# Patient Record
Sex: Male | Born: 1960 | Race: White | Hispanic: No | Marital: Single | Smoking: Current some day smoker
Health system: Southern US, Community
[De-identification: ages and names within clinical notes are randomized; demographics above are authoritative.]

## PROBLEM LIST (undated history)

## (undated) DIAGNOSIS — I10 Essential (primary) hypertension: Secondary | ICD-10-CM

## (undated) HISTORY — PX: TONSILLECTOMY: SUR1361

---

## 2002-09-13 ENCOUNTER — Inpatient Hospital Stay (HOSPITAL_COMMUNITY): Admission: EM | Admit: 2002-09-13 | Discharge: 2002-09-16 | Payer: Self-pay | Admitting: Emergency Medicine

## 2002-09-13 ENCOUNTER — Encounter: Payer: Self-pay | Admitting: Emergency Medicine

## 2002-09-29 ENCOUNTER — Encounter: Payer: Self-pay | Admitting: General Surgery

## 2002-09-29 ENCOUNTER — Ambulatory Visit (HOSPITAL_COMMUNITY): Admission: RE | Admit: 2002-09-29 | Discharge: 2002-09-29 | Payer: Self-pay | Admitting: General Surgery

## 2003-08-23 ENCOUNTER — Ambulatory Visit (HOSPITAL_COMMUNITY): Admission: RE | Admit: 2003-08-23 | Discharge: 2003-08-23 | Payer: Self-pay | Admitting: Gastroenterology

## 2003-08-23 ENCOUNTER — Encounter (INDEPENDENT_AMBULATORY_CARE_PROVIDER_SITE_OTHER): Payer: Self-pay | Admitting: *Deleted

## 2009-12-05 ENCOUNTER — Emergency Department (HOSPITAL_COMMUNITY): Admission: EM | Admit: 2009-12-05 | Discharge: 2009-12-05 | Payer: Self-pay | Admitting: Emergency Medicine

## 2010-06-29 LAB — DIFFERENTIAL
Basophils Absolute: 0 10*3/uL (ref 0.0–0.1)
Basophils Relative: 0 % (ref 0–1)
Eosinophils Absolute: 0.1 10*3/uL (ref 0.0–0.7)
Monocytes Absolute: 0.6 10*3/uL (ref 0.1–1.0)
Monocytes Relative: 8 % (ref 3–12)
Neutro Abs: 5.2 10*3/uL (ref 1.7–7.7)
Neutrophils Relative %: 67 % (ref 43–77)

## 2010-06-29 LAB — CBC
HCT: 43.9 % (ref 39.0–52.0)
MCH: 30.4 pg (ref 26.0–34.0)
MCHC: 35.1 g/dL (ref 30.0–36.0)
MCV: 86.6 fL (ref 78.0–100.0)
Platelets: 189 10*3/uL (ref 150–400)
RDW: 13.4 % (ref 11.5–15.5)
WBC: 7.8 10*3/uL (ref 4.0–10.5)

## 2010-06-29 LAB — POCT CARDIAC MARKERS
CKMB, poc: 1.1 ng/mL (ref 1.0–8.0)
Troponin i, poc: <0.05 ng/mL (ref 0.00–0.09)
Troponin i, poc: <0.05 ng/mL (ref 0.00–0.09)

## 2010-06-29 LAB — POCT I-STAT, CHEM 8
Calcium, Ion: 1.01 mmol/L — ABNORMAL LOW (ref 1.12–1.32)
Chloride: 108 mEq/L (ref 96–112)
Glucose, Bld: 88 mg/dL (ref 70–99)
HCT: 48 % (ref 39.0–52.0)
Hemoglobin: 16.3 g/dL (ref 13.0–17.0)
Potassium: 4.2 mEq/L (ref 3.5–5.1)

## 2010-09-01 NOTE — Op Note (Signed)
NAME:  Manuel Singleton, Manuel Singleton                         ACCOUNT NO.:  [id number]   MEDICAL RECORD NO.:  [id number]                   PATIENT TYPE:  AMB   LOCATION:  ENDO                                 FACILITY:  [facility]   PHYSICIAN:  Petra Kuba, M.D.                 DATE OF BIRTH:  [birth year]   DATE OF PROCEDURE:  08/23/2003  DATE OF DISCHARGE:                                 OPERATIVE REPORT   PROCEDURE:  Colonoscopy.   INDICATIONS FOR PROCEDURE:  Patient with a history of diverticulitis,  abnormal CAT scan, want to rule out any other abnormality.   Consent was signed after risks, benefits, methods, and options were  thoroughly discussed by my nurse in the office and me prior to sedation.   MEDICINES USED:  Demerol 75, Versed 8.   DESCRIPTION OF PROCEDURE:  Rectal inspection was pertinent for small  external hemorrhoids. Digital exam was negative. The pediatric video  adjustable colonoscope was inserted and with some difficulty due to a  tortous sigmoid once through this area with abdominal pressure and rolling  him on his back, we were able to advance to the cecum.  On insertion, some  right greater than left diverticula was seen but no other abnormalities. The  cecum was identified by the appendiceal orifice and the ileocecal valve. In  fact, the scope was inserted a short ways into the terminal ileum which was  normal. The scope was slowly withdrawn. The prep was adequate. There was  some liquid stool that required washing and suctioning. On slow withdrawal  through the colon, the right greater then left diverticula were confirmed.  In the distal sigmoid and rectum, a few hyperplastic appearing polyps were  seen and were cold biopsied. Anal rectal pullthrough and retroflexion  confirmed some small hemorrhoids.  The scope was reinserted a short ways up  the left side of  the colon, air was suctioned, scope removed. The patient  tolerated the procedure well. There was no obvious or  immediate  complications.   ENDOSCOPIC DIAGNOSIS:  1. Internal and external hemorrhoids.  2. Right greater than left diverticula.  3. A few tiny hyperplastic appearing rectal and distal sigmoid polyps cold     biopsied.  4. Otherwise within normal limits to the terminal ileum.   PLAN:  Await pathology to determine future colonic screening.  Care with  nuts, seeds and popcorn.  Give him some diverticular information. Return  care to Dr. Artis Flock and Carolynne Edouard and I will be on standby to help p.r.n.                                               Petra Kuba, M.D.    MEM/MEDQ  D:  08/23/2003  T:  08/23/2003  Job:  [id number]  cc:   Quita Skye. Artis Flock, M.D.  [address]  [place]  [place] [address]  Fax: [phone]   Ollen Gross. Vernell Morgans, M.D.  [address]  [place]  [place] [address]  Fax: [phone]

## 2010-09-01 NOTE — H&P (Signed)
NAME:  GEOVANNI, RAHMING                         ACCOUNT NO.:  [id number]   MEDICAL RECORD NO.:  [id number]                   PATIENT TYPE:  EMS   LOCATION:  MINO                                 FACILITY:  [facility]   PHYSICIAN:  Ollen Gross. Vernell Morgans, M.D.              DATE OF BIRTH:  Sep 25, 1960   DATE OF ADMISSION:  09/13/2002  DATE OF DISCHARGE:                                HISTORY & PHYSICAL   CHIEF COMPLAINT:  Right-sided abdominal pain.   HISTORY OF PRESENT ILLNESS:  Mr. Tomaro is a 50 year old white male who  states that he initially began having some pain about 2-3 days ago.  He  initially describes the pain as some burning in his periumbilical and  epigastric sort of region.  The pain then migrated over to his right sort of  upper quadrant.  The pain has not been associated with any nausea or  vomiting.  He has not had a fever until today.  His fever today was  reportedly 102.  Because of the pain has been progressing, he came to the  emergency department for further evaluation.  His bowels have been moving a  regular basis.  They have been a little bit looser the last couple of days  than normal, but he had not noticed any blood in them.  The pain is  localized to his right upper quadrant.  It does not radiate to his back or  down into his right lower quadrant.  His appetite has been okay.  He states  he has otherwise been healthy.   REVIEW OF SYSTEMS:  He denies any nausea, vomiting, fever, chills, chest  pain, shortness of breath, diarrhea, or dysuria.  The rest of his review of  systems is fairly unremarkable.   PAST MEDICAL HISTORY:  None.   PAST SURGICAL HISTORY:  Significant for a tonsillectomy.   MEDICATIONS:  None.   ALLERGIES:  None.   SOCIAL HISTORY:  He occasionally drinks alcohol.  He denies any tobacco use.   FAMILY HISTORY:  Significant for diabetes in his parents.   PHYSICAL EXAMINATION:  GENERAL:  He is well-developed, well-nourished, white  male in no  acute distress.  SKIN:  Warm and dry with no jaundice.  EYES:  His extraocular motors are intact.  Pupils are equal, round, and  reactive to light.  Sclerae are nonicteric.  NECK:  His neck has no bruits.  I cannot palpate any thyroid masses.  His  trachea is midline.  LUNGS:  Clear bilaterally with no use of accessory or respiratory muscles.  HEART:  Regular rate and rhythm with impulse in the left chest.  ABDOMEN:  Soft.  He is nontender on the left side of his abdomen.  He has  some tenderness and guarding in the right upper quadrant.  He has no  palpable mass or hepatosplenomegaly.  No evidence of peritonitis.  He has  some  slight abdominal distention.  EXTREMITIES:  No cyanosis, clubbing, or edema.  PSYCHOLOGICAL:  He is alert and oriented times three with no evidence today  of anxiety or depression.   LABORATORY:  On review of his lab work, he had a creatinine of 1.3, his  total bilirubin 1.4, alk phos 41, SGOT 23, SGPT 36.  His white count is  19,800, hemoglobin 14.7, hematocrit 42.4, and a platelet count of 176,000.  His UA is negative.  On evaluation of his CT scan, he has what appears to be  diverticulitis at his hepatic flexure.  His appendix is visualized and looks  normal.  He also has a small atrophic, but still slightly functioning left  kidney.   ASSESSMENT AND PLAN:  This is a 50 year old white male with three days of  right-sided abdominal pain, fever, and an elevated white count.  His CT scan  shows significant hepatic flexure and diverticulitis.  It is impossible by  scan to rule out a tumor in this area of bowel although the bowel wall does  appear to be fairly thickened from the inflammatory process.  I recommended  admission to the hospital.  We will plan to start him on broad-spectrum  antibiotics to cover colonic floor.  Hopefully, we will be able to cool down  the inflammatory process around his colon, and then have him evaluated by GI  to rule out any tumor.   If this is diverticulitis with no complication, than  we will talk to him about his options.  If he does not improve, he may have  to have surgery to remove this diseased portion of colon.  We will recheck  his white count over the next couple of days and follow his abdominal exam.                                               Ollen Gross. Vernell Morgans, M.D.    PST/MEDQ  D:  09/13/2002  T:  09/13/2002  Job:  [id number]

## 2010-09-01 NOTE — Discharge Summary (Signed)
   NAME:  JOS, CYGAN NO.:  [id number]   MEDICAL RECORD NO.:  [id number]                   PATIENT TYPE:  INP   LOCATION:  5714                                 FACILITY:  [facility]   PHYSICIAN:  Ollen Gross. Vernell Morgans, M.D.              DATE OF BIRTH:  Jun 23, 1960   DATE OF ADMISSION:  09/13/2002  DATE OF DISCHARGE:  09/16/2002                                 DISCHARGE SUMMARY   HISTORY:  Gratefully, the patient is a 50 year old gentleman who was  admitted with right upper quadrant pain.  He was found to have a hepatic  flexure diverticulitis with no complicating features.  He was started on  bowel rest and IV antibiotics and his fevers and white count improved over  the ensuing couple of days and at that point he was switched to oral Cipro  and Flagyl and started initially on a clear liquid diet and advanced to a  low-residue diet and on 09/16/02 he was ready for discharge to home and he  had gotten pretty much pain free.   His activity is as tolerated.  His diet is low-residue.   MEDICATIONS:  He was given prescriptions for:  1. Cipro.  2. Flagyl.  3. Pain medicine as needed.  4. He needs to resume his home medications.   FOLLOWUP:  Followup will be with Dr. Carolynne Edouard in a couple of weeks.   FINAL DIAGNOSIS:  Hepatic flexure diverticulitis and he is discharged home.                                               Ollen Gross. Vernell Morgans, M.D.    PST/MEDQ  D:  10/06/2002  T:  10/07/2002  Job:  [id number]

## 2010-09-02 ENCOUNTER — Emergency Department (HOSPITAL_COMMUNITY): Payer: 59

## 2010-09-02 ENCOUNTER — Emergency Department (HOSPITAL_COMMUNITY)
Admission: EM | Admit: 2010-09-02 | Discharge: 2010-09-02 | Disposition: A | Discharge status: Home / Self Care | Payer: 59 | Attending: Emergency Medicine | Admitting: Emergency Medicine

## 2010-09-02 DIAGNOSIS — X500XXA Overexertion from strenuous movement or load, initial encounter: Secondary | ICD-10-CM | POA: Insufficient documentation

## 2010-09-02 DIAGNOSIS — Y9367 Activity, basketball: Secondary | ICD-10-CM | POA: Insufficient documentation

## 2010-09-02 DIAGNOSIS — IMO0002 Reserved for concepts with insufficient information to code with codable children: Secondary | ICD-10-CM | POA: Insufficient documentation

## 2010-09-02 DIAGNOSIS — M25519 Pain in unspecified shoulder: Secondary | ICD-10-CM | POA: Insufficient documentation

## 2010-09-05 ENCOUNTER — Other Ambulatory Visit (HOSPITAL_COMMUNITY): Payer: Self-pay | Admitting: Orthopedic Surgery

## 2010-09-05 ENCOUNTER — Ambulatory Visit (HOSPITAL_COMMUNITY)
Admission: RE | Admit: 2010-09-05 | Discharge: 2010-09-05 | Disposition: A | Discharge status: Home / Self Care | Payer: 59 | Source: Ambulatory Visit | Attending: Orthopedic Surgery | Admitting: Orthopedic Surgery

## 2010-09-05 DIAGNOSIS — T1590XA Foreign body on external eye, part unspecified, unspecified eye, initial encounter: Secondary | ICD-10-CM

## 2010-09-05 DIAGNOSIS — Z01818 Encounter for other preprocedural examination: Secondary | ICD-10-CM | POA: Insufficient documentation

## 2015-04-12 ENCOUNTER — Ambulatory Visit
Admission: RE | Admit: 2015-04-12 | Discharge: 2015-04-12 | Disposition: A | Discharge status: Home / Self Care | Payer: Commercial Managed Care - HMO | Source: Ambulatory Visit | Attending: Internal Medicine | Admitting: Internal Medicine

## 2015-04-12 ENCOUNTER — Other Ambulatory Visit: Payer: Self-pay | Admitting: Internal Medicine

## 2015-04-12 DIAGNOSIS — J188 Other pneumonia, unspecified organism: Secondary | ICD-10-CM

## 2015-06-12 ENCOUNTER — Encounter (HOSPITAL_COMMUNITY): Payer: Self-pay | Admitting: Emergency Medicine

## 2015-06-12 ENCOUNTER — Emergency Department (HOSPITAL_COMMUNITY): Payer: Commercial Managed Care - HMO

## 2015-06-12 ENCOUNTER — Inpatient Hospital Stay (HOSPITAL_COMMUNITY)
Admission: EM | Admit: 2015-06-12 | Discharge: 2015-06-16 | DRG: 183 | Disposition: A | Discharge status: Home Health | Payer: Commercial Managed Care - HMO | Attending: General Surgery | Admitting: General Surgery

## 2015-06-12 DIAGNOSIS — R0789 Other chest pain: Secondary | ICD-10-CM | POA: Diagnosis present

## 2015-06-12 DIAGNOSIS — S2232XA Fracture of one rib, left side, initial encounter for closed fracture: Secondary | ICD-10-CM | POA: Diagnosis present

## 2015-06-12 DIAGNOSIS — R509 Fever, unspecified: Secondary | ICD-10-CM

## 2015-06-12 DIAGNOSIS — K5900 Constipation, unspecified: Secondary | ICD-10-CM | POA: Diagnosis not present

## 2015-06-12 DIAGNOSIS — F17210 Nicotine dependence, cigarettes, uncomplicated: Secondary | ICD-10-CM | POA: Diagnosis present

## 2015-06-12 DIAGNOSIS — W108XXA Fall (on) (from) other stairs and steps, initial encounter: Secondary | ICD-10-CM | POA: Diagnosis present

## 2015-06-12 DIAGNOSIS — W19XXXA Unspecified fall, initial encounter: Secondary | ICD-10-CM | POA: Diagnosis present

## 2015-06-12 DIAGNOSIS — J918 Pleural effusion in other conditions classified elsewhere: Secondary | ICD-10-CM | POA: Diagnosis present

## 2015-06-12 DIAGNOSIS — N179 Acute kidney failure, unspecified: Secondary | ICD-10-CM | POA: Diagnosis present

## 2015-06-12 DIAGNOSIS — I10 Essential (primary) hypertension: Secondary | ICD-10-CM | POA: Diagnosis present

## 2015-06-12 DIAGNOSIS — J189 Pneumonia, unspecified organism: Secondary | ICD-10-CM | POA: Diagnosis present

## 2015-06-12 DIAGNOSIS — R0902 Hypoxemia: Secondary | ICD-10-CM | POA: Diagnosis present

## 2015-06-12 DIAGNOSIS — W010XXA Fall on same level from slipping, tripping and stumbling without subsequent striking against object, initial encounter: Secondary | ICD-10-CM

## 2015-06-12 DIAGNOSIS — Z885 Allergy status to narcotic agent status: Secondary | ICD-10-CM

## 2015-06-12 DIAGNOSIS — S2242XA Multiple fractures of ribs, left side, initial encounter for closed fracture: Secondary | ICD-10-CM | POA: Diagnosis present

## 2015-06-12 DIAGNOSIS — R748 Abnormal levels of other serum enzymes: Secondary | ICD-10-CM

## 2015-06-12 HISTORY — DX: Essential (primary) hypertension: I10

## 2015-06-12 LAB — CBC WITH DIFFERENTIAL/PLATELET
Basophils Absolute: 0 10*3/uL (ref 0.0–0.1)
Basophils Relative: 0 %
EOS ABS: 0.1 10*3/uL (ref 0.0–0.7)
EOS PCT: 0 %
HCT: 50.6 % (ref 39.0–52.0)
Hemoglobin: 17.2 g/dL — ABNORMAL HIGH (ref 13.0–17.0)
LYMPHS ABS: 1.1 10*3/uL (ref 0.7–4.0)
Lymphocytes Relative: 7 %
MCH: 30.4 pg (ref 26.0–34.0)
MCHC: 34 g/dL (ref 30.0–36.0)
MCV: 89.4 fL (ref 78.0–100.0)
MONO ABS: 1.9 10*3/uL — AB (ref 0.1–1.0)
Monocytes Relative: 12 %
Neutro Abs: 13.1 10*3/uL — ABNORMAL HIGH (ref 1.7–7.7)
Neutrophils Relative %: 81 %
PLATELETS: 161 10*3/uL (ref 150–400)
RBC: 5.66 MIL/uL (ref 4.22–5.81)
RDW: 13.1 % (ref 11.5–15.5)
WBC: 16.1 10*3/uL — AB (ref 4.0–10.5)

## 2015-06-12 LAB — URINALYSIS, ROUTINE W REFLEX MICROSCOPIC
BILIRUBIN URINE: NEGATIVE
Glucose, UA: NEGATIVE mg/dL
HGB URINE DIPSTICK: NEGATIVE
KETONES UR: 15 mg/dL — AB
Leukocytes, UA: NEGATIVE
Nitrite: NEGATIVE
PROTEIN: 30 mg/dL — AB
Specific Gravity, Urine: 1.022 (ref 1.005–1.030)
pH: 6 (ref 5.0–8.0)

## 2015-06-12 LAB — URINE MICROSCOPIC-ADD ON: BACTERIA UA: NONE SEEN

## 2015-06-12 LAB — CK: CK TOTAL: 758 U/L — AB (ref 49–397)

## 2015-06-12 LAB — I-STAT CG4 LACTIC ACID, ED
LACTIC ACID, VENOUS: 1.01 mmol/L (ref 0.5–2.0)
Lactic Acid, Venous: 2.04 mmol/L (ref 0.5–2.0)

## 2015-06-12 LAB — COMPREHENSIVE METABOLIC PANEL
ALT: 60 U/L (ref 17–63)
AST: 46 U/L — AB (ref 15–41)
Albumin: 4.2 g/dL (ref 3.5–5.0)
Alkaline Phosphatase: 48 U/L (ref 38–126)
Anion gap: 14 (ref 5–15)
BILIRUBIN TOTAL: 1.3 mg/dL — AB (ref 0.3–1.2)
BUN: 15 mg/dL (ref 6–20)
CO2: 25 mmol/L (ref 22–32)
CREATININE: 1.29 mg/dL — AB (ref 0.61–1.24)
Calcium: 9.6 mg/dL (ref 8.9–10.3)
Chloride: 98 mmol/L — ABNORMAL LOW (ref 101–111)
GFR calc Af Amer: >60 mL/min (ref >60)
GLUCOSE: 137 mg/dL — AB (ref 65–99)
Potassium: 4.3 mmol/L (ref 3.5–5.1)
Sodium: 137 mmol/L (ref 135–145)
TOTAL PROTEIN: 7.3 g/dL (ref 6.5–8.1)

## 2015-06-12 MED ORDER — OXYCODONE HCL 5 MG PO TABS: 5.0000 mg | ORAL_TABLET | ORAL | Status: DC | PRN

## 2015-06-12 MED ORDER — SODIUM CHLORIDE 0.9 % IV BOLUS (SEPSIS)
1000.0000 mL | INTRAVENOUS | Status: AC
  Administered 2015-06-12 (×3): 1000 mL via INTRAVENOUS

## 2015-06-12 MED ORDER — DEXTROSE 5 % IV SOLN
500.0000 mg | INTRAVENOUS | Status: DC
  Administered 2015-06-13 – 2015-06-15 (×2): 500 mg via INTRAVENOUS
  Filled 2015-06-12 (×3): qty 500

## 2015-06-12 MED ORDER — FENTANYL CITRATE (PF) 100 MCG/2ML IJ SOLN
100.0000 ug | Freq: Once | INTRAMUSCULAR | Status: AC
  Administered 2015-06-12: 100 ug via INTRAVENOUS

## 2015-06-12 MED ORDER — HYDROMORPHONE HCL 1 MG/ML IJ SOLN
1.0000 mg | Freq: Once | INTRAMUSCULAR | Status: AC
Start: 2015-06-12 — End: 2015-06-12
  Administered 2015-06-12: 1 mg via INTRAVENOUS
  Filled 2015-06-12: qty 1

## 2015-06-12 MED ORDER — DEXTROSE 5 % IV SOLN
500.0000 mg | Freq: Once | INTRAVENOUS | Status: AC
  Administered 2015-06-12: 500 mg via INTRAVENOUS
  Filled 2015-06-12: qty 500

## 2015-06-12 MED ORDER — SODIUM CHLORIDE 0.9 % IV SOLN
INTRAVENOUS | Status: DC
  Administered 2015-06-12 – 2015-06-13 (×2): via INTRAVENOUS

## 2015-06-12 MED ORDER — ONDANSETRON HCL 4 MG/2ML IJ SOLN
4.0000 mg | Freq: Four times a day (QID) | INTRAMUSCULAR | Status: DC | PRN
  Filled 2015-06-12: qty 2

## 2015-06-12 MED ORDER — FENTANYL CITRATE (PF) 100 MCG/2ML IJ SOLN
INTRAMUSCULAR | Status: AC
  Filled 2015-06-12: qty 2

## 2015-06-12 MED ORDER — DOCUSATE SODIUM 100 MG PO CAPS
100.0000 mg | ORAL_CAPSULE | Freq: Two times a day (BID) | ORAL | Status: DC
  Administered 2015-06-12 – 2015-06-16 (×8): 100 mg via ORAL
  Filled 2015-06-12 (×8): qty 1

## 2015-06-12 MED ORDER — HYDROMORPHONE HCL 1 MG/ML IJ SOLN
1.0000 mg | INTRAMUSCULAR | Status: DC | PRN
  Administered 2015-06-12 – 2015-06-13 (×3): 1 mg via INTRAVENOUS
  Filled 2015-06-12 (×3): qty 1

## 2015-06-12 MED ORDER — SODIUM CHLORIDE 0.9 % IV BOLUS (SEPSIS)
500.0000 mL | INTRAVENOUS | Status: AC
  Administered 2015-06-12: 500 mL via INTRAVENOUS

## 2015-06-12 MED ORDER — ONDANSETRON HCL 4 MG PO TABS
4.0000 mg | ORAL_TABLET | Freq: Four times a day (QID) | ORAL | Status: DC | PRN
Start: 2015-06-12 — End: 2015-06-16

## 2015-06-12 MED ORDER — DEXTROSE 5 % IV SOLN
1.0000 g | Freq: Once | INTRAVENOUS | Status: AC
  Administered 2015-06-12: 1 g via INTRAVENOUS
  Filled 2015-06-12: qty 10

## 2015-06-12 MED ORDER — ENOXAPARIN SODIUM 40 MG/0.4ML ~~LOC~~ SOLN
40.0000 mg | Freq: Every day | SUBCUTANEOUS | Status: DC
  Administered 2015-06-12: 40 mg via SUBCUTANEOUS
  Filled 2015-06-12: qty 0.4

## 2015-06-12 MED ORDER — OXYCODONE HCL 5 MG PO TABS
10.0000 mg | ORAL_TABLET | ORAL | Status: DC | PRN
  Administered 2015-06-13 (×3): 10 mg via ORAL
  Filled 2015-06-12 (×3): qty 2

## 2015-06-12 MED ORDER — DEXTROSE 5 % IV SOLN
1.0000 g | INTRAVENOUS | Status: DC
  Administered 2015-06-13 – 2015-06-15 (×3): 1 g via INTRAVENOUS
  Filled 2015-06-12 (×4): qty 10

## 2015-06-12 NOTE — ED Provider Notes (Signed)
Complains of left-sided chest pain worse with moving or changing positions after he tripped and fell 2 days ago. No loss of consciousness. No other associated symptoms. He's been lying in his bed since falling unable to move secondary to pain since the event. On exam appears uncomfortable. On exam alert last coma score 15 appears uncomfortable chest is tender left-sided, laterally. No crepitance or flail. Abdomen nondistended nontender. Pelvis stable nontender. All 4 extremities or contusion abrasion or tenderness neurovascular intact  Orlie Dakin, MD 06/12/15 2330

## 2015-06-12 NOTE — ED Notes (Signed)
Patient transported to CT 

## 2015-06-12 NOTE — Progress Notes (Signed)
Pharmacy Code Sepsis Protocol  Time of code sepsis page: [id number] []  Antibiotics delivered at  [x]  Antibiotics administered (Ordered) prior to code at [facility] (if checked, omit next 2 questions)  Were antibiotics ordered at the time of the code sepsis page? No Was it required to contact the physician? [x]  Physician not contacted []  Physician contacted to order antibiotics for code sepsis []  Physician contacted to recommend changing antibiotics  Pharmacy consulted for: N/A  Anti-infectives    Start     Dose/Rate Route Frequency Ordered Stop   06/13/15 1900  cefTRIAXone (ROCEPHIN) 1 g in dextrose 5 % 50 mL IVPB     1 g 100 mL/hr over 30 Minutes Intravenous Every 24 hours 06/12/15 1822     06/13/15 1900  azithromycin (ZITHROMAX) 500 mg in dextrose 5 % 250 mL IVPB     500 mg 250 mL/hr over 60 Minutes Intravenous Every 24 hours 06/12/15 1822     06/12/15 1815  cefTRIAXone (ROCEPHIN) 1 g in dextrose 5 % 50 mL IVPB     1 g 100 mL/hr over 30 Minutes Intravenous  Once 06/12/15 1813     06/12/15 1815  azithromycin (ZITHROMAX) 500 mg in dextrose 5 % 250 mL IVPB     500 mg 250 mL/hr over 60 Minutes Intravenous  Once 06/12/15 1813          Nurse education provided: []  Minutes left to administer antibiotics to achieve 1 hour goal []  Correct order of antibiotic administration []  Antibiotic Y-site compatibilities     Shomari Scicchitano, Jake Church, PharmD 06/12/2015, 6:53 PM

## 2015-06-12 NOTE — ED Notes (Addendum)
Pt tripped and fell down 3 stairs on Friday, landed against L side hitting ribcage and LUE.  Pt was not seen after fall. Pt denies LOC, denies hitting head.  Pt denies dizziness, lightheadedness.  Pt reports SOB , worse with movement.  Pt reports new fever starting today.  EMS reports giving 100 mcg Fentanyl.

## 2015-06-12 NOTE — ED Notes (Signed)
H. Muttherbaugh. PA  at bedside.

## 2015-06-12 NOTE — ED Notes (Signed)
MD at bedside. 

## 2015-06-12 NOTE — ED Provider Notes (Signed)
CSN: [id number]     Arrival date & time 06/12/15  1727 History   First MD Initiated Contact with Patient 06/12/15 1729     Chief Complaint  Patient presents with  . Fall  . Chest Pain  . Fever     (Consider location/radiation/quality/duration/timing/severity/associated sxs/prior Treatment) The history is provided by the patient, a significant other and the EMS personnel. No language interpreter was used.     Manuel Singleton is a 55 y.o. male  with a hx of HTN presents to the Emergency Department complaining of gradual, persistent, progressively worsening with rib pain onset 3 days ago. Patient reports that he tripped over an extension cord, falling onto a railing which gave way causing him to fall down 3 stairs. Nursing note states 3 flights of stairs however patient is very clear that he only fell down 3 stairs. He reports that he immediately got up, went to bed and has not been out of bed since that time. He has been unable to get up even to urinate.  He reports severe pain to the left ribs and left back. EMS was called today due to severe shortness of breath. Patient found to be hypoxic and febrile on their arrival. He was transported via long spine board due to difficulty moving patient.   Patient reports taking hydrocodone and oxycodone prior to arrival without improvement. Palpation makes it worse. Patient denies cough, chills, abdominal pain, nausea, vomiting, diarrhea, dizziness, syncope.    Past Medical History  Diagnosis Date  . Hypertension    Past Surgical History  Procedure Laterality Date  . Tonsillectomy     No family history on file. Social History  Substance Use Topics  . Smoking status: Current Some Day Smoker    Types: Cigarettes  . Smokeless tobacco: Current User    Types: Snuff  . Alcohol Use: Yes     Comment: occassionally    Review of Systems  Constitutional: Positive for fever. Negative for diaphoresis, appetite change, fatigue and unexpected weight  change.  HENT: Negative for mouth sores.   Eyes: Negative for visual disturbance.  Respiratory: Positive for shortness of breath. Negative for cough, chest tightness and wheezing.   Cardiovascular: Positive for chest pain.  Gastrointestinal: Negative for nausea, vomiting, abdominal pain, diarrhea and constipation.  Endocrine: Negative for polydipsia, polyphagia and polyuria.  Genitourinary: Negative for dysuria, urgency, frequency and hematuria.  Musculoskeletal: Positive for back pain. Negative for neck stiffness.  Skin: Negative for rash.  Allergic/Immunologic: Negative for immunocompromised state.  Neurological: Negative for syncope, light-headedness and headaches.  Hematological: Does not bruise/bleed easily.  Psychiatric/Behavioral: Negative for sleep disturbance. The patient is not nervous/anxious.       Allergies  Morphine and related  Home Medications   Prior to Admission medications   Medication Sig Start Date End Date Taking? Authorizing Provider  HYDROcodone-acetaminophen (NORCO/VICODIN) 5-325 MG tablet Take 1 tablet by mouth daily as needed. 03/24/15  Yes Historical Provider, MD  ibuprofen (ADVIL,MOTRIN) 200 MG tablet Take 400 mg by mouth every 6 (six) hours as needed for moderate pain.   Yes Historical Provider, MD  oxyCODONE-acetaminophen (PERCOCET/ROXICET) 5-325 MG tablet Take 1 tablet by mouth daily as needed. 03/29/15  Yes Historical Provider, MD   BP 149/90 mmHg  Pulse 88  Temp(Src) 102.4 F (39.1 C) (Oral)  Resp 18  Ht 6\' 2"  (1.88 m)  Wt 108.863 kg  BMI 30.80 kg/m2  SpO2 97% Physical Exam  Constitutional: He appears well-developed and well-nourished. No distress.  Awake, alert, nontoxic appearance  HENT:  Head: Normocephalic and atraumatic.  Mouth/Throat: Oropharynx is clear and moist. No oropharyngeal exudate.  Eyes: Conjunctivae are normal. No scleral icterus.  Neck: Normal range of motion. Neck supple.  Cardiovascular: Normal rate, regular rhythm and  intact distal pulses.   Pulmonary/Chest: Effort normal. Tachypnea noted. No respiratory distress. He has decreased breath sounds in the left middle field and the left lower field. He has no wheezes. He exhibits tenderness.  Equal chest expansion Clear but coarse breath sounds in right lung fields anteriorly and laterally Course breath sounds left upper field, absent left lower and left lateral fields Unable to assess posterior breath sounds due to patient positioning and pain  Abdominal: Soft. Bowel sounds are normal. He exhibits no mass. There is no tenderness. There is no rebound and no guarding.  Musculoskeletal: Normal range of motion. He exhibits no edema.  Neurological: He is alert.  Speech is clear and goal oriented Moves extremities without ataxia  Skin: Skin is warm and dry. He is not diaphoretic.  Psychiatric: He has a normal mood and affect.  Nursing note and vitals reviewed.   ED Course  Procedures (including critical care time) Labs Review Labs Reviewed  COMPREHENSIVE METABOLIC PANEL - Abnormal; Notable for the following:    Chloride 98 (*)    Glucose, Bld 137 (*)    Creatinine, Ser 1.29 (*)    AST 46 (*)    Total Bilirubin 1.3 (*)    All other components within normal limits  CBC WITH DIFFERENTIAL/PLATELET - Abnormal; Notable for the following:    WBC 16.1 (*)    Hemoglobin 17.2 (*)    Neutro Abs 13.1 (*)    Monocytes Absolute 1.9 (*)    All other components within normal limits  CK - Abnormal; Notable for the following:    Total CK 758 (*)    All other components within normal limits  I-STAT CG4 LACTIC ACID, ED - Abnormal; Notable for the following:    Lactic Acid, Venous 2.04 (*)    All other components within normal limits  CULTURE, BLOOD (ROUTINE X 2)  CULTURE, BLOOD (ROUTINE X 2)  URINE CULTURE  URINALYSIS, ROUTINE W REFLEX MICROSCOPIC (NOT AT [facility])  I-STAT CG4 LACTIC ACID, ED    Imaging Review Ct Chest Wo Contrast  06/12/2015  CLINICAL DATA:  Fall 2  days ago down stairs. Left-sided rib pain. Chest and back pain. Shortness of breath. Sepsis. Pneumonia. EXAM: CT CHEST WITHOUT CONTRAST TECHNIQUE: Multidetector CT imaging of the chest was performed following the standard protocol without IV contrast. COMPARISON:  06/12/2015 FINDINGS: Mediastinum/Nodes: Very minimal atherosclerotic calcification of the aortic arch. Lungs/Pleura: Small bilateral pleural effusions. There is a small amount of hematoma along the left pleural space associated with the various rib fractures. Indistinct mild airspace opacities are scattered in the right lung. Suspected mild atelectasis in the lingula. Passive atelectasis in both lower lobes. No pneumothorax. Upper abdomen: Hepatic steatosis. Musculoskeletal: There are anterior fractures of the left third, fourth, fifth, and sixth ribs which are relatively nondisplaced; and also fractures of the left fourth, fifth, sixth, seventh, eighth, and ninth ribs which are variably displaced, with the sixth rib being the most displaced about 1 bone width with slight overlap. Despite the presence of the small right pleural effusion, I do not see a definite right-sided rib fracture. Note is made of generally hypertrophic musculature in the upper chest and shoulders. IMPRESSION: 1. Left anterior third through sixth rib fractures and  left posterior fourth through ninth displaced rib fractures. Small left pleural effusion with passive atelectasis. 2. There is a is trace right pleural effusion with passive atelectasis but without definite right-sided rib fractures. 3. There is some scattered due a somewhat nodular airspace opacities in both lungs potentially pulmonary contusions or multifocal pneumonia. These are present along with mild scattered atelectasis. 4. Hepatic steatosis. Electronically Signed   By: Van Clines M.D.   On: 06/12/2015 20:27   Dg Chest Portable 1 View  06/12/2015  CLINICAL DATA:  Fall 2 days ago. Severe chest pain.  Decreased breath sounds on left side. EXAM: PORTABLE CHEST 1 VIEW COMPARISON:  04/12/2015 FINDINGS: Exam is suboptimal due to low lung volumes. Mild atelectasis noted in both lung bases. No evidence of pneumothorax or hemothorax. No evidence of pulmonary consolidation. Heart size is probably within normal limits allowing for low lung volumes. Trachea is midline. IMPRESSION: Low lung volumes with mild bibasilar atelectasis. No definite pneumothorax or hemothorax visualized. Electronically Signed   By: Earle Gell M.D.   On: 06/12/2015 18:49   I have personally reviewed and evaluated these images and lab results as part of my medical decision-making.   CRITICAL CARE Performed by: Abigail Butts Total critical care time: 45 minutes Critical care time was exclusive of separately billable procedures and treating other patients. Critical care was necessary to treat or prevent imminent or life-threatening deterioration. Critical care was time spent personally by me on the following activities: development of treatment plan with patient and/or surrogate as well as nursing, discussions with consultants, evaluation of patient's response to treatment, examination of patient, obtaining history from patient or surrogate, ordering and performing treatments and interventions, ordering and review of laboratory studies, ordering and review of radiographic studies, pulse oximetry and re-evaluation of patient's condition.   MDM   Final diagnoses:  Rib fractures, left, closed, initial encounter  Fall from slip, trip, or stumble, initial encounter  Fever, unspecified fever cause  Elevated CK  Community acquired pneumonia   Manuel Singleton presents with fever, left rib pain, hypoxia 3 days after fall.  He reports severe shortness of breath. He has been unable to move since the fall.   Pt unable to roll over on his own.    Elevated lactic acid, leukocytosis and elevated CK.  Patient is febrile and hypoxic,  requiring 3 L of oxygen to maintain his saturations. New oxygen requirement.  Patient treated with azithromycin and ceftriaxone.  CT shows rib fractures 3 through 9 with small pleural effusion.  Airspace opacities noted question pulmonary contusion versus multifocal pneumonia however based on his fever, elevated lactic acid and new oxygen requirement I suspect pneumonia. Will admit to trauma services.   8:49 PM Discussed with Dr. Georgette Dover who will admit.    The patient was discussed with and seen by Dr. Winfred Leeds who agrees with the treatment plan.   Jarrett Soho Zabdiel Dripps, PA-C 06/12/15 2055  Orlie Dakin, MD 06/12/15 2330

## 2015-06-12 NOTE — H&P (Signed)
Manuel Singleton is an 55 y.o. male.   Chief Complaint: Hypoxia/ left chest pain HPI: This is a 55 yo male who fell down three stairs and landed on concrete about 48 hours ago.  He had severe left chest pain and has been in bed since that time.  He had to urinate in a bottle and has not had a bowel movement since that time.  EMS was called today because of fever and shortness of breath.  He was found to be hypoxic.  He had some PRN Vicodin and Percocet, but this was not able to control his pain.    Past Medical History  Diagnosis Date  . Hypertension     Past Surgical History  Procedure Laterality Date  . Tonsillectomy      No family history on file. Social History:  reports that he has been smoking Cigarettes.  His smokeless tobacco use includes Snuff. He reports that he drinks alcohol. He reports that he does not use illicit drugs.  Allergies:  Allergies  Allergen Reactions  . Morphine And Related Itching   Prior to Admission medications   Medication Sig Start Date End Date Taking? Authorizing Provider  HYDROcodone-acetaminophen (NORCO/VICODIN) 5-325 MG tablet Take 1 tablet by mouth daily as needed. 03/24/15  Yes Historical Provider, MD  ibuprofen (ADVIL,MOTRIN) 200 MG tablet Take 400 mg by mouth every 6 (six) hours as needed for moderate pain.   Yes Historical Provider, MD  oxyCODONE-acetaminophen (PERCOCET/ROXICET) 5-325 MG tablet Take 1 tablet by mouth daily as needed. 03/29/15  Yes Historical Provider, MD     Results for orders placed or performed during the hospital encounter of 06/12/15 (from the past 48 hour(s))  Comprehensive metabolic panel     Status: Abnormal   Collection Time: 06/12/15  5:38 PM  Result Value Ref Range   Sodium 137 135 - 145 mmol/L   Potassium 4.3 3.5 - 5.1 mmol/L   Chloride 98 (L) 101 - 111 mmol/L   CO2 25 22 - 32 mmol/L   Glucose, Bld 137 (H) 65 - 99 mg/dL   BUN 15 6 - 20 mg/dL   Creatinine, Ser 1.29 (H) 0.61 - 1.24 mg/dL   Calcium 9.6 8.9 - 10.3  mg/dL   Total Protein 7.3 6.5 - 8.1 g/dL   Albumin 4.2 3.5 - 5.0 g/dL   AST 46 (H) 15 - 41 U/L   ALT 60 17 - 63 U/L   Alkaline Phosphatase 48 38 - 126 U/L   Total Bilirubin 1.3 (H) 0.3 - 1.2 mg/dL   GFR calc non Af Amer >60 >60 mL/min   GFR calc Af Amer >60 >60 mL/min    Comment: (NOTE) The eGFR has been calculated using the CKD EPI equation. This calculation has not been validated in all clinical situations. eGFR's persistently <60 mL/min signify possible Chronic Kidney Disease.    Anion gap 14 5 - 15  CBC WITH DIFFERENTIAL     Status: Abnormal   Collection Time: 06/12/15  5:38 PM  Result Value Ref Range   WBC 16.1 (H) 4.0 - 10.5 K/uL   RBC 5.66 4.22 - 5.81 MIL/uL   Hemoglobin 17.2 (H) 13.0 - 17.0 g/dL   HCT 50.6 39.0 - 52.0 %   MCV 89.4 78.0 - 100.0 fL   MCH 30.4 26.0 - 34.0 pg   MCHC 34.0 30.0 - 36.0 g/dL   RDW 13.1 11.5 - 15.5 %   Platelets 161 150 - 400 K/uL   Neutrophils Relative %  81 %   Neutro Abs 13.1 (H) 1.7 - 7.7 K/uL   Lymphocytes Relative 7 %   Lymphs Abs 1.1 0.7 - 4.0 K/uL   Monocytes Relative 12 %   Monocytes Absolute 1.9 (H) 0.1 - 1.0 K/uL   Eosinophils Relative 0 %   Eosinophils Absolute 0.1 0.0 - 0.7 K/uL   Basophils Relative 0 %   Basophils Absolute 0.0 0.0 - 0.1 K/uL  CK     Status: Abnormal   Collection Time: 06/12/15  5:38 PM  Result Value Ref Range   Total CK 758 (H) 49 - 397 U/L  I-Stat CG4 Lactic Acid, ED  (not at  [facility])     Status: Abnormal   Collection Time: 06/12/15  6:23 PM  Result Value Ref Range   Lactic Acid, Venous 2.04 (HH) 0.5 - 2.0 mmol/L   Comment NOTIFIED PHYSICIAN   Urinalysis, Routine w reflex microscopic (not at [facility])     Status: Abnormal   Collection Time: 06/12/15  8:26 PM  Result Value Ref Range   Color, Urine YELLOW YELLOW   APPearance CLEAR CLEAR   Specific Gravity, Urine 1.022 1.005 - 1.030   pH 6.0 5.0 - 8.0   Glucose, UA NEGATIVE NEGATIVE mg/dL   Hgb urine dipstick NEGATIVE NEGATIVE   Bilirubin Urine NEGATIVE  NEGATIVE   Ketones, ur 15 (A) NEGATIVE mg/dL   Protein, ur 30 (A) NEGATIVE mg/dL   Nitrite NEGATIVE NEGATIVE   Leukocytes, UA NEGATIVE NEGATIVE  Urine microscopic-add on     Status: Abnormal   Collection Time: 06/12/15  8:26 PM  Result Value Ref Range   Squamous Epithelial / LPF 0-5 (A) NONE SEEN   WBC, UA 0-5 0 - 5 WBC/hpf   RBC / HPF 0-5 0 - 5 RBC/hpf   Bacteria, UA NONE SEEN NONE SEEN   Ct Chest Wo Contrast  06/12/2015  CLINICAL DATA:  Fall 2 days ago down stairs. Left-sided rib pain. Chest and back pain. Shortness of breath. Sepsis. Pneumonia. EXAM: CT CHEST WITHOUT CONTRAST TECHNIQUE: Multidetector CT imaging of the chest was performed following the standard protocol without IV contrast. COMPARISON:  06/12/2015 FINDINGS: Mediastinum/Nodes: Very minimal atherosclerotic calcification of the aortic arch. Lungs/Pleura: Small bilateral pleural effusions. There is a small amount of hematoma along the left pleural space associated with the various rib fractures. Indistinct mild airspace opacities are scattered in the right lung. Suspected mild atelectasis in the lingula. Passive atelectasis in both lower lobes. No pneumothorax. Upper abdomen: Hepatic steatosis. Musculoskeletal: There are anterior fractures of the left third, fourth, fifth, and sixth ribs which are relatively nondisplaced; and also fractures of the left fourth, fifth, sixth, seventh, eighth, and ninth ribs which are variably displaced, with the sixth rib being the most displaced about 1 bone width with slight overlap. Despite the presence of the small right pleural effusion, I do not see a definite right-sided rib fracture. Note is made of generally hypertrophic musculature in the upper chest and shoulders. IMPRESSION: 1. Left anterior third through sixth rib fractures and left posterior fourth through ninth displaced rib fractures. Small left pleural effusion with passive atelectasis. 2. There is a is trace right pleural effusion with  passive atelectasis but without definite right-sided rib fractures. 3. There is some scattered due a somewhat nodular airspace opacities in both lungs potentially pulmonary contusions or multifocal pneumonia. These are present along with mild scattered atelectasis. 4. Hepatic steatosis. Electronically Signed   By: Van Clines M.D.   On: 06/12/2015 20:27  Dg Chest Portable 1 View  06/12/2015  CLINICAL DATA:  Fall 2 days ago. Severe chest pain. Decreased breath sounds on left side. EXAM: PORTABLE CHEST 1 VIEW COMPARISON:  04/12/2015 FINDINGS: Exam is suboptimal due to low lung volumes. Mild atelectasis noted in both lung bases. No evidence of pneumothorax or hemothorax. No evidence of pulmonary consolidation. Heart size is probably within normal limits allowing for low lung volumes. Trachea is midline. IMPRESSION: Low lung volumes with mild bibasilar atelectasis. No definite pneumothorax or hemothorax visualized. Electronically Signed   By: Earle Gell M.D.   On: 06/12/2015 18:49    Review of Systems  Constitutional: Negative for weight loss.  HENT: Negative for ear discharge, ear pain, hearing loss and tinnitus.   Eyes: Negative for blurred vision, double vision, photophobia and pain.  Respiratory: Positive for shortness of breath. Negative for cough and sputum production.   Cardiovascular: Positive for chest pain.  Gastrointestinal: Negative for nausea, vomiting and abdominal pain.  Genitourinary: Negative for dysuria, urgency, frequency and flank pain.  Musculoskeletal: Positive for back pain and falls. Negative for myalgias, joint pain and neck pain.  Neurological: Negative for dizziness, tingling, sensory change, focal weakness, loss of consciousness and headaches.  Endo/Heme/Allergies: Does not bruise/bleed easily.  Psychiatric/Behavioral: Negative for depression, memory loss and substance abuse. The patient is not nervous/anxious.     Blood pressure 151/75, pulse 87, temperature  102.4 F (39.1 C), temperature source Oral, resp. rate 19, height _0  (1.88 m), weight 108.863 kg (240 lb), SpO2 98 %. Physical Exam  Constitutional: He is oriented to person, place, and time. He appears well-developed and well-nourished.  HENT:  Head: Normocephalic and atraumatic.  Eyes: EOM are normal. Pupils are equal, round, and reactive to light.  Neck: Normal range of motion. Neck supple.  Cardiovascular: Normal rate and regular rhythm.   Respiratory: He exhibits tenderness (Left anterior/ left posterior).  Bibasilar crackles   GI: Soft. Bowel sounds are normal.  Musculoskeletal: Normal range of motion.  Neurological: He is alert and oriented to person, place, and time.  Skin: Skin is warm and dry.  Did not inspect skin on back side due to pain with positioning.  Will have nursing evaluate skin when his pain is under better control.  Psychiatric: He has a normal mood and affect. His behavior is normal.     Assessment/Plan 1.  Fall with left anterior 3-6 rib fractures and left posterior 4-9 displaced rib fractures/ associated bilateral pleural effusions 2.  Bilateral multifocal pneumonia 3.  Inadequate pain control 4.  Mild AKI due to immobility - elevated CK/ Creatinine  Admit for IV hydration and pain control IV antibiotics for community-acquired pneumonia Mobilize Incentive spirometer/ oxygen  Undra Harriman K., MD 06/12/2015, 9:26 PM

## 2015-06-13 ENCOUNTER — Encounter: Payer: Self-pay | Admitting: Orthopedic Surgery

## 2015-06-13 DIAGNOSIS — J189 Pneumonia, unspecified organism: Secondary | ICD-10-CM | POA: Diagnosis present

## 2015-06-13 DIAGNOSIS — W19XXXA Unspecified fall, initial encounter: Secondary | ICD-10-CM | POA: Diagnosis present

## 2015-06-13 LAB — COMPREHENSIVE METABOLIC PANEL
ALK PHOS: 34 U/L — AB (ref 38–126)
ALT: 43 U/L (ref 17–63)
ANION GAP: 7 (ref 5–15)
AST: 30 U/L (ref 15–41)
Albumin: 3.3 g/dL — ABNORMAL LOW (ref 3.5–5.0)
BUN: 11 mg/dL (ref 6–20)
CO2: 26 mmol/L (ref 22–32)
CREATININE: 1.16 mg/dL (ref 0.61–1.24)
Calcium: 8.1 mg/dL — ABNORMAL LOW (ref 8.9–10.3)
Chloride: 102 mmol/L (ref 101–111)
Glucose, Bld: 116 mg/dL — ABNORMAL HIGH (ref 65–99)
Potassium: 4 mmol/L (ref 3.5–5.1)
Sodium: 135 mmol/L (ref 135–145)
Total Bilirubin: 1.2 mg/dL (ref 0.3–1.2)
Total Protein: 5.8 g/dL — ABNORMAL LOW (ref 6.5–8.1)

## 2015-06-13 LAB — CBC
HCT: 40.4 % (ref 39.0–52.0)
HEMOGLOBIN: 13.5 g/dL (ref 13.0–17.0)
MCH: 29.9 pg (ref 26.0–34.0)
MCHC: 33.4 g/dL (ref 30.0–36.0)
MCV: 89.6 fL (ref 78.0–100.0)
Platelets: 135 10*3/uL — ABNORMAL LOW (ref 150–400)
RBC: 4.51 MIL/uL (ref 4.22–5.81)
RDW: 13.1 % (ref 11.5–15.5)
WBC: 11.5 10*3/uL — ABNORMAL HIGH (ref 4.0–10.5)

## 2015-06-13 LAB — URINE CULTURE: Culture: 8000

## 2015-06-13 MED ORDER — OXYCODONE HCL 5 MG PO TABS
10.0000 mg | ORAL_TABLET | ORAL | Status: DC | PRN
  Administered 2015-06-13 (×2): 15 mg via ORAL
  Administered 2015-06-14 – 2015-06-15 (×5): 20 mg via ORAL
  Administered 2015-06-15 – 2015-06-16 (×4): 10 mg via ORAL
  Administered 2015-06-16: 15 mg via ORAL
  Administered 2015-06-16 (×2): 10 mg via ORAL
  Filled 2015-06-13: qty 4
  Filled 2015-06-13 (×2): qty 2
  Filled 2015-06-13: qty 4
  Filled 2015-06-13: qty 3
  Filled 2015-06-13: qty 4
  Filled 2015-06-13 (×2): qty 2
  Filled 2015-06-13: qty 3
  Filled 2015-06-13: qty 4
  Filled 2015-06-13: qty 3
  Filled 2015-06-13: qty 4
  Filled 2015-06-13 (×2): qty 2
  Filled 2015-06-13: qty 3

## 2015-06-13 MED ORDER — NAPROXEN 250 MG PO TABS
500.0000 mg | ORAL_TABLET | Freq: Two times a day (BID) | ORAL | Status: DC
  Administered 2015-06-13 – 2015-06-16 (×6): 500 mg via ORAL
  Filled 2015-06-13 (×7): qty 2

## 2015-06-13 MED ORDER — ENOXAPARIN SODIUM 30 MG/0.3ML ~~LOC~~ SOLN
30.0000 mg | Freq: Two times a day (BID) | SUBCUTANEOUS | Status: DC
  Administered 2015-06-13 – 2015-06-15 (×5): 30 mg via SUBCUTANEOUS
  Filled 2015-06-13 (×6): qty 0.3

## 2015-06-13 MED ORDER — POLYETHYLENE GLYCOL 3350 17 G PO PACK
17.0000 g | PACK | Freq: Every day | ORAL | Status: DC
  Administered 2015-06-14 – 2015-06-16 (×3): 17 g via ORAL
  Filled 2015-06-13 (×4): qty 1

## 2015-06-13 MED ORDER — HYDROMORPHONE HCL 1 MG/ML IJ SOLN
0.5000 mg | INTRAMUSCULAR | Status: DC | PRN
  Filled 2015-06-13: qty 1

## 2015-06-13 MED ORDER — DIPHENHYDRAMINE HCL 25 MG PO CAPS
50.0000 mg | ORAL_CAPSULE | Freq: Four times a day (QID) | ORAL | Status: DC | PRN
  Administered 2015-06-13 – 2015-06-16 (×8): 50 mg via ORAL
  Filled 2015-06-13 (×8): qty 2

## 2015-06-13 NOTE — Progress Notes (Signed)
Patient ID: Manuel Singleton, male   DOB: 26-Sep-1960, 55 y.o.   MRN: [id number]   LOS: 1 day   Subjective: Hurting   Objective: Vital signs in last 24 hours: Temp:  [98.8 F (37.1 C)-102.4 F (39.1 C)] 98.8 F (37.1 C) (02/27 0557) Pulse Rate:  [72-95] 72 (02/27 0557) Resp:  [16-26] 18 (02/27 0557) BP: (128-162)/(72-96) 128/72 mmHg (02/27 0557) SpO2:  [85 %-99 %] 94 % (02/27 0557) Weight:  [id number] kg (240 lb)] 108.863 kg (240 lb) (02/26 1739) Last BM Date: 06/10/15   Laboratory  CBC  Recent Labs  06/12/15 1738 06/13/15 0501  WBC 16.1* 11.5*  HGB 17.2* 13.5  HCT 50.6 40.4  PLT 161 135*   BMET  Recent Labs  06/12/15 1738 06/13/15 0501  NA 137 135  K 4.3 4.0  CL 98* 102  CO2 25 26  GLUCOSE 137* 116*  BUN 15 11  CREATININE 1.29* 1.16  CALCIUM 9.6 8.1*    Physical Exam General appearance: alert and no distress Resp: clear to auscultation bilaterally Cardio: regular rate and rhythm GI: normal findings: bowel sounds normal and soft, non-tender   Assessment/Plan: Fall Multiple left rib fxs -- Pulmonary toilet PNA -- On Zithromax/Rocephin D2 empiric. He's not coughing so a sputum sample is not possible. +fevers but WBC improved. FEN -- Add NSAID, SL IV VTE -- SCD's, Lovenox (increase for weight) Dispo -- PT/OT eval, pain control    Lisette Abu, PA-C Pager: [phone] General Trauma PA Pager: [phone]  06/13/2015

## 2015-06-13 NOTE — Evaluation (Signed)
Physical Therapy Evaluation Patient Details Name: Manuel Singleton MRN: [id number] DOB: [birth year] Today's Date: 06/13/2015   History of Present Illness  HPI: This is a 55 yo male who fell down three stairs and landed on concrete about 48 hours prior to admission; L rib fxs and hypoxia; PMH: HTN  Clinical Impression  Pt admitted with above diagnosis. Pt currently with functional limitations due to the deficits listed below (see PT Problem List).  Pt will benefit from skilled PT to increase their independence and safety with mobility to allow discharge to the venue listed below.    Hopeful for good progress as his pain is controlled and he get more used to moving in this condition; Per pt, he lives alone and must be independent and go up and down stairs daily;    He did not tolerate walking today; initiated session on Room Air and he desatted to 85%, so continued session on 2 liters supplemental O2, and stas ranged 88-93%      Follow Up Recommendations Home health PT    Equipment Recommendations  Rolling walker with 5" wheels;3in1 (PT);Other (comment) (Maybe, will monitor for progress)    Recommendations for Other Services OT consult     Precautions / Restrictions Precautions Precaution Comments: watch O2 sats      Mobility  Bed Mobility Overal bed mobility: Needs Assistance Bed Mobility: Supine to Sit     Supine to sit: Mod assist     General bed mobility comments: Pt declining trying to roll to the right and push up sidelying to sit; he moved his own feet off of the be, then initiated getting up pulling rail with R hand; Pain and anxiety cased him to stop midway, and he needed heavy mod assist to come all the way to sit and square off hips at EOB  Transfers Overall transfer level: Needs assistance Equipment used: None Transfers: Sit to/from Stand Sit to Stand: Min guard         General transfer comment: Max encouragement to stand; Very slow moving, and unwilling to  accept help to stand; when he finally did stand, he did not need physical assist  Ambulation/Gait Ambulation/Gait assistance: Min guard Ambulation Distance (Feet):  (pivot steps bed to chair) Assistive device: None       General Gait Details: no loss of balance wih steps to chair  Stairs            Wheelchair Mobility    Modified Rankin (Stroke Patients Only)       Balance Overall balance assessment: Needs assistance             Standing balance comment: Once finally standing, he did not want to sit down; nursing notified                             Pertinent Vitals/Pain Pain Assessment: 0-10 Pain Score: 10-Worst pain ever Pain Location: L side/ribs, with any motion -- in particular bed mobitliy this session Pain Descriptors / Indicators: Aching;Sharp;Stabbing (reports he feels pops and ribs moving with breathing) Pain Intervention(s): Monitored during session;Premedicated before session;Repositioned    Home Living Family/patient expects to be discharged to:: Private residence Living Arrangements: Alone   Type of Home: House Home Access: Stairs to enter   Technical brewer of Steps: 10 Home Layout:  (Unknown) Home Equipment: None Additional Comments: Will need more info re: home setup; He was so very internally distracted by pain on PT  eval    Prior Function Level of Independence: Independent         Comments: Works for [employer]   Dominant Hand: Right    Extremity/Trunk Assessment   Upper Extremity Assessment:  (pt hesitant to move LUE due to pain)           Lower Extremity Assessment: Overall WFL for tasks assessed         Communication   Communication: No difficulties  Cognition Arousal/Alertness: Awake/alert Behavior During Therapy: Anxious;Agitated;Restless Overall Cognitive Status: Within Functional Limits for tasks assessed (though significantly internally distracted with anticipation of  pain)                      General Comments General comments (skin integrity, edema, etc.): Much education provided re; the need to breathe deeply, move, get OOB, and walk; Educated in use of pillow splint to support sore ribs    Exercises        Assessment/Plan    PT Assessment Patient needs continued PT services  PT Diagnosis Acute pain   PT Problem List Decreased range of motion;Decreased activity tolerance;Decreased mobility;Decreased knowledge of use of DME;Decreased safety awareness;Decreased knowledge of precautions;Cardiopulmonary status limiting activity;Pain  PT Treatment Interventions DME instruction;Gait training;Stair training;Functional mobility training;Therapeutic activities;Therapeutic exercise;Patient/family education   PT Goals (Current goals can be found in the Care Plan section) Acute Rehab PT Goals Patient Stated Goal: did not state PT Goal Formulation: With patient Time For Goal Achievement: 06/20/15 Potential to Achieve Goals: Good    Frequency Min 6X/week   Barriers to discharge Decreased caregiver support He must be independent to manage at home    Co-evaluation               End of Session Equipment Utilized During Treatment: Oxygen Activity Tolerance: Patient limited by pain Patient left: in chair;with call bell/phone within reach (sitting edge of chair) Nurse Communication: Mobility status         Time: [id number] PT Time Calculation (min) (ACUTE ONLY): 36 min   Charges:   PT Evaluation $PT Eval Moderate Complexity: 1 Procedure PT Treatments $Therapeutic Activity: 8-22 mins   PT G Codes:        Quin Hoop 06/13/2015, 4:21 PM  [place], [place]  Acute Rehabilitation Services Pager [phone] Office [phone]

## 2015-06-14 NOTE — Clinical Social Work Note (Signed)
Clinical Social Work Assessment  Patient Details  Name: Manuel Singleton MRN: [id number] Date of Birth: [birth year]  Date of referral:  06/14/15               Reason for consult:  Discharge Planning, Trauma                Permission sought to share information with:    Permission granted to share information::  No  Name::        Agency::     Relationship::     Contact Information:     Housing/Transportation Living arrangements for the past 2 months:  Single Family Home Source of Information:  Patient Patient Interpreter Needed:  None Criminal Activity/Legal Involvement Pertinent to Current Situation/Hospitalization:  No - Comment as needed Significant Relationships:  Significant Other, Siblings, Friend Lives with:  Self Do you feel safe going back to the place where you live?  Yes Need for family participation in patient care:  Yes (Comment)  Care giving concerns:  Patient does not report any concerns at this time.   Social Worker assessment / plan:  CSW met with patient to complete assessment and SBIRT. The patient states that he experienced a fall at home which resulted in his hospitalization. He denies any current symptoms of acute stress response. CSW inquired about substance use/ETOH use. The patient states that he does not use ETOH or any other substances. SBIRT completed. The patient reports that he plans to return home at discharge. He states that he has a supportive girlfriend. The patient states that he would rather go to outpatient physical therapy instead of HHPT if he actually needs continued rehab at discharge [facility] made aware). CSW signing off as the patient does not present with any other CSW related needs at this time.  Employment status:    Insurance information:  Managed Care PT Recommendations:  Home with [place] / Referral to community resources:  SBIRT  Patient/Family's Response to care:  The patient appears to be happy with the care he has  received.   Patient/Family's Understanding of and Emotional Response to Diagnosis, Current Treatment, and Prognosis:  The patient has good understanding of the reason for his admission and understands what his post DC needs will be. The patient is coping well with the hospitalization and looks forward to returning home at discharge.   Emotional Assessment Appearance:  Appears stated age Attitude/Demeanor/Rapport:  Other (Patient is appropriate and welcoming of CSW.) Affect (typically observed):  Appropriate, Calm, Pleasant Orientation:  Oriented to Self, Oriented to Place, Oriented to  Time, Oriented to Situation Alcohol / Substance use:  Not Applicable Psych involvement (Current and /or in the community):  No (Comment)  Discharge Needs  Concerns to be addressed:  Discharge Planning Concerns Readmission within the last 30 days:  No Current discharge risk:  Physical Impairment Barriers to Discharge:  Continued Medical Work up   Rigoberto Noel, LCSW 06/14/2015, 3:44 PM

## 2015-06-14 NOTE — Progress Notes (Signed)
Occupational Therapy Evaluation Patient Details Name: Manuel Singleton MRN: [id number] DOB: [birth year] Today's Date: 06/14/2015    History of Present Illness HPI: This is a 55 yo male who fell down three stairs and landed on concrete about 48 hours prior to admission; L rib fxs and hypoxia; PMH: HTN   Clinical Impression   PTA, pt was independent with ADLs and mobility. Pt currently requires close min guard assist for functional transfers and refused to perform any ADLs due to pain and not wanting to "be assessed just to be kicked out of the hospital today." Educated role of therapy services in hospital and discussed pt's goals - pt more receptive to therapy after this. Pt has girlfriend and friends willing to help, but stated "I don't want anyone in my house. I'm a man, you're a woman and I want to do things myself." Took max encouragement for pt to allow therapist to use gait belt and stand close for balance and safety. Currently not recommending any OT follow up or DME at pt's request. Will continue to follow acutely.  During activity on RA, pt SaO2 dropped as low as 71%, but averaged between 81-85%. Encouraged to use breathing strategies with minimal effect on O2 sats. Returned to supplemental O2 and pt's SaO2 returned to 98-100%.    Follow Up Recommendations  No OT follow up;Supervision/Assistance - 24 hour    Equipment Recommendations  None recommended by OT (pt refused needing any)    Recommendations for Other Services       Precautions / Restrictions Precautions Precautions: None Precaution Comments: watch O2 sats Restrictions Weight Bearing Restrictions: No      Mobility Bed Mobility               General bed mobility comments: Pt up in chair  on OT arrival  Transfers Overall transfer level: Needs assistance Equipment used: None Transfers: Sit to/from Stand Sit to Stand: Min guard         General transfer comment: Max encouragement to stand. Extremely slow  to rise (~6 minutes). Upon standing pt did not need any physical assistance for balance. On RA pts SaO2 decreased as low as 71% but averaged between 81-85%. Encouraged pt to use breathing strategies.    Balance Overall balance assessment: Needs assistance Sitting-balance support: No upper extremity supported;Feet supported Sitting balance-Leahy Scale: Fair Sitting balance - Comments: due to pain unable to weight shift much   Standing balance support: No upper extremity supported;During functional activity Standing balance-Leahy Scale: Good                              ADL Overall ADL's : Needs assistance/impaired                     Lower Body Dressing: Supervision/safety;Cueing for compensatory techniques;Sit to/from stand Lower Body Dressing Details (indicate cue type and reason): Able to cross ankle-over-knee, bending forward too painful Toilet Transfer: Min guard;Ambulation;Regular Glass blower/designer Details (indicate cue type and reason): Pt refused to sit down on commode to complete transfer because "it'll take too long to get back up"         Functional mobility during ADLs: Min guard General ADL Comments: Pt adamant about not wanting people in his home to help him including friends and his girlfriend. Pt reports he is very independent and will make it work at home, but is upset that he is being pushed out  of the hospital so fast. Pt unwilling to listen to any of the suggestions from the therapist regarding compensatory ADL strategies or to decrease his risk of falling.     Vision Vision Assessment?: No apparent visual deficits   Perception     Praxis      Pertinent Vitals/Pain Pain Assessment: 0-10 Pain Score: 7  Pain Location: L side/ribs Pain Descriptors / Indicators: Aching;Sharp Pain Intervention(s): Limited activity within patient's tolerance;Monitored during session;Premedicated before session;Repositioned     Hand Dominance Right    Extremity/Trunk Assessment Upper Extremity Assessment Upper Extremity Assessment: LUE deficits/detail LUE Deficits / Details: Unable to move much due to rib pain LUE: Unable to fully assess due to pain   Lower Extremity Assessment Lower Extremity Assessment: Overall WFL for tasks assessed   Cervical / Trunk Assessment Cervical / Trunk Assessment: Normal   Communication Communication Communication: No difficulties   Cognition Arousal/Alertness: Awake/alert Behavior During Therapy: WFL for tasks assessed/performed Overall Cognitive Status: Within Functional Limits for tasks assessed                     General Comments       Exercises       Shoulder Instructions      Home Living Family/patient expects to be discharged to:: Private residence Living Arrangements: Alone Available Help at Discharge: Other (Comment) (Pt does not want assistance from anyone) Type of Home: House Home Access: Stairs to enter Entrance Stairs-Number of Steps: 10         Bathroom Shower/Tub: Walk-in shower;Door   [employer] Toilet: Standard Bathroom Accessibility: Yes How Accessible: Accessible via walker Home Equipment: Hand held shower head          Prior Functioning/Environment Level of Independence: Independent        Comments: Works for [employer]. Has 2 sons that go to [facility]    OT Diagnosis: Acute pain   OT Problem List: Decreased strength;Decreased range of motion;Decreased activity tolerance;Impaired balance (sitting and/or standing);Decreased coordination;Decreased safety awareness;Pain   OT Treatment/Interventions: Self-care/ADL training;Therapeutic exercise;Energy conservation;DME and/or AE instruction;Therapeutic activities;Patient/family education;Balance training    OT Goals(Current goals can be found in the care plan section) Acute Rehab OT Goals Patient Stated Goal: to stay several more days in the hospital OT Goal Formulation: With patient Time For  Goal Achievement: 06/28/15 Potential to Achieve Goals: Good ADL Goals Pt Will Perform Grooming: with modified independence;standing Pt Will Perform Lower Body Bathing: with modified independence;sit to/from stand Pt Will Perform Lower Body Dressing: with modified independence;sit to/from stand Pt Will Transfer to Toilet: with modified independence;regular height toilet;ambulating Pt Will Perform Toileting - Clothing Manipulation and hygiene: with modified independence;sit to/from stand Pt Will Perform Tub/Shower Transfer: Shower transfer;ambulating;with modified independence  OT Frequency: Min 2X/week   Barriers to D/C: Decreased caregiver support  Lives alone and refusing any assistance from family/friends       Co-evaluation              End of Session Equipment Utilized During Treatment: Gait belt Nurse Communication: Mobility status;Patient requests pain meds  Activity Tolerance: Patient limited by pain Patient left: in chair;with call bell/phone within reach   Time: [id number] OT Time Calculation (min): 30 min Charges:  OT General Charges $OT Visit: 1 Procedure OT Evaluation $OT Eval Moderate Complexity: 1 Procedure OT Treatments $Self Care/Home Management : 8-22 mins G-Codes:    Redmond Baseman, OTR/L [phone] 06/14/2015, 5:03 PM

## 2015-06-14 NOTE — Progress Notes (Signed)
Physical Therapy Treatment Patient Details Name: Manuel Singleton MRN: [id number] DOB: 18-Jun-1960 Today's Date: 06/14/2015    History of Present Illness HPI: This is a 55 yo male who fell down three stairs and landed on concrete about 48 hours prior to admission; L rib fxs and hypoxia; PMH: HTN    PT Comments    Making progress with mobility; Took time to validate pt's frustration with the situation, and gain some rapport, with goal of increasing pt's participation in therapy; Needing Max encouragement to move, but is motivated to be more independent; Initiated amb on [employer], and O2 sats did decr to 87%, restarted O2 2 LPM -- will plan to perform another O2 qualifying walk and address steps in his home in the am tomorrow; Will also try RW   Gershon Mussel is so distracted by pain that I opted to solely focus on the act of walking this session; We need to get a better picture of how much assist may be available to him at home; He indicated he lives alone to me, but also mentioned possible help from girlfriend to his nurse   Follow Up Recommendations  Home health PT     Equipment Recommendations  Rolling walker with 5" wheels;3in1 (PT);Other (comment) (Maybe, will monitor for progress)    Recommendations for Other Services OT consult     Precautions / Restrictions Precautions Precaution Comments: watch O2 sats    Mobility  Bed Mobility               General bed mobility comments: Pt is declining practice with bed mobility; states he will stay on the couch or in a recliner at home  Transfers Overall transfer level: Needs assistance Equipment used: None Transfers: Sit to/from Stand Sit to Stand: Min guard         General transfer comment: Max encouragement to stand; Very slow moving, and unwilling to accept help to stand; when he finally did stand, he did not need physical assist  Ambulation/Gait Ambulation/Gait assistance: Min guard;Supervision Ambulation Distance (Feet): 75  Feet Assistive device:  (Wtih RUE on Dinam[employer] for support; LUE hugging pillow) Gait Pattern/deviations: Step-through pattern;Decreased stride length;Wide base of support Gait velocity: extremely slow   General Gait Details: very slow moving; Used RUE on Dinamap for suport during amb   Stairs            Wheelchair Mobility    Modified Rankin (Stroke Patients Only)       Balance                                    Cognition Arousal/Alertness: Awake/alert Behavior During Therapy: Anxious;Restless;WFL for tasks assessed/performed Overall Cognitive Status: Within Functional Limits for tasks assessed (though significantly internally distracted with the anticipation of pain)                      Exercises      General Comments General comments (skin integrity, edema, etc.): Much education provided re; the need to breathe deeply, move, get OOB, and walk; Educated in use of pillow splint to support sore ribs      Pertinent Vitals/Pain Pain Assessment: Faces Faces Pain Scale: Hurts even more Pain Location: L side/ribs Pain Descriptors / Indicators: Aching;Crying;Sharp;Tender (reports he feels his ribs moving) Pain Intervention(s): Monitored during session;Premedicated before session    Home Living  Prior Function            PT Goals (current goals can now be found in the care plan section) Acute Rehab PT Goals Patient Stated Goal: pain t decr PT Goal Formulation: With patient Time For Goal Achievement: 06/20/15 Potential to Achieve Goals: Good Progress towards PT goals: Progressing toward goals    Frequency  Min 6X/week    PT Plan Current plan remains appropriate    Co-evaluation             End of Session Equipment Utilized During Treatment: Oxygen Activity Tolerance: Patient limited by pain Patient left: in chair;with call bell/phone within reach (sitting edge of chair)     Time: [id number] PT  Time Calculation (min) (ACUTE ONLY): 47 min  Charges:  $Gait Training: 23-37 mins $Therapeutic Activity: 8-22 mins                    G Codes:      Quin Hoop 06/14/2015, 12:30 PM  Roney Marion, [place] Pager [phone] Office [phone]

## 2015-06-14 NOTE — Progress Notes (Signed)
Patient ID: Manuel Singleton, male   DOB: May 29, 1960, 55 y.o.   MRN: [id number]   LOS: 2 days   Subjective: Doing ok, still very sore. Has been trying to limit narcotics as he doesn't like the way they make him feel. Had some low O2 sats with PT yesterday.   Objective: Vital signs in last 24 hours: Temp:  [97.5 F (36.4 C)-99.4 F (37.4 C)] 97.5 F (36.4 C) (02/28 0504) Pulse Rate:  [76-79] 79 (02/28 0504) Resp:  [16-18] 17 (02/28 0504) BP: (135-152)/(82-92) 135/92 mmHg (02/28 0504) SpO2:  [91 %-96 %] 96 % (02/28 0504) Last BM Date: 06/10/15   IS: 1261ml (+558ml)   Physical Exam General appearance: alert and no distress Resp: clear to auscultation bilaterally Cardio: regular rate and rhythm GI: normal findings: bowel sounds normal and soft, non-tender   Assessment/Plan: Fall Multiple left rib fxs -- Pulmonary toilet PNA -- On Zithromax/Rocephin D3 empiric. He's not coughing so a sputum sample is not possible. Afebrile. FEN -- Encouraged more frequent but lower dose narcotics VTE -- SCD's, Lovenox  Dispo -- PT/OT eval, pain control. Home once PT clears and oxygen dependency resolved.    Lisette Abu, PA-C Pager: [phone] General Trauma PA Pager: [phone]  06/14/2015

## 2015-06-15 MED ORDER — AZITHROMYCIN 500 MG PO TABS
500.0000 mg | ORAL_TABLET | Freq: Every day | ORAL | Status: DC
  Administered 2015-06-15: 500 mg via ORAL
  Filled 2015-06-15: qty 1

## 2015-06-15 NOTE — Progress Notes (Signed)
Physical Therapy Treatment Patient Details Name: SRIHAAN BEEVERS MRN: [id number] DOB: [birth year] Today's Date: 06/15/2015    History of Present Illness HPI: This is a 55 yo male who fell down three stairs and landed on concrete about 48 hours prior to admission; L rib fxs and hypoxia; PMH: HTN    PT Comments    Walking more today with less support; Great improvements in O2 sats with amb, walked on Room Air and O2 sats remained greater than or equal to 90% (ranged 93-97% for most of the session)  He is refusing any DME, and may possibly decline HHPT;   Given improvements over past 3 days, another night for PT and OT sessions tomorrow is not unreasonable -- he will need to be able to independently manage at home.   Follow Up Recommendations  Home health PT     Equipment Recommendations  Other (comment) (Refusing any assistive devices)    Recommendations for Other Services       Precautions / Restrictions Precautions Precautions: None Precaution Comments: watch O2 sats    Mobility  Bed Mobility               General bed mobility comments: States he plans to sleep in recliner  Transfers Overall transfer level: Needs assistance Equipment used: None Transfers: Sit to/from Stand Sit to Stand: Supervision         General transfer comment: Max encouragement to stand; Very slow moving, and unwilling to accept help to stand; when he finally did stand, he did not need physical assist  Ambulation/Gait Ambulation/Gait assistance: Supervision Ambulation Distance (Feet): 80 Feet Assistive device: None Gait Pattern/deviations: Decreased step length - right;Decreased step length - left;Wide base of support Gait velocity: extremely slow   General Gait Details: Still quite slow moving; no extra UE suport today from dinamap or pushing IV pole; steps slowed with increased distance; encouragement porvided; able to do entire walk on Room Air   Stairs             Wheelchair Mobility    Modified Rankin (Stroke Patients Only)       Balance     Sitting balance-Leahy Scale: Fair Sitting balance - Comments: due to pain unable to weight shift much     Standing balance-Leahy Scale: Good                      Cognition Arousal/Alertness: Awake/alert Behavior During Therapy: WFL for tasks assessed/performed Overall Cognitive Status: Within Functional Limits for tasks assessed                      Exercises      General Comments General comments (skin integrity, edema, etc.): Continuing discussion re: the benefits of walking an moving -- not only for breathing, but also for moving bowels      Pertinent Vitals/Pain Pain Assessment: 0-10 Pain Score: 8  Pain Location: L side/ribs; pain greater towards the end of the walk Pain Descriptors / Indicators: Aching;Grimacing;Guarding;Sharp Pain Intervention(s): Premedicated before session    Home Living                      Prior Function            PT Goals (current goals can now be found in the care plan section) Acute Rehab PT Goals Patient Stated Goal: less pain PT Goal Formulation: With patient Time For Goal Achievement: 06/20/15 Potential to Achieve Goals: Good  Progress towards PT goals: Progressing toward goals    Frequency  Min 6X/week    PT Plan Current plan remains appropriate    Co-evaluation             End of Session   Activity Tolerance: Patient limited by pain Patient left: in chair;with call bell/phone within reach (sitting edge of chair)     Time: [id number] PT Time Calculation (min) (ACUTE ONLY): 47 min  Charges:  $Gait Training: 38-52 mins                    G Codes:      Quin Hoop 06/15/2015, 1:22 PM  Roney Marion, [place] Pager [phone] Office [phone]

## 2015-06-15 NOTE — Progress Notes (Signed)
[facility] Progress Note     Subjective: Pain controlled; Complaints of constipation and bloating. Patient states his abdomen is swollen and tight. Denies SOB, abdominal pain.  Objective: Vital signs in last 24 hours: Temp:  [98.1 F (36.7 C)-99.5 F (37.5 C)] 98.1 F (36.7 C) (03/01 0434) Pulse Rate:  [69-79] 72 (03/01 0434) Resp:  [16] 16 (03/01 0434) BP: (115-157)/(50-86) 129/77 mmHg (03/01 0434) SpO2:  [95 %-98 %] 96 % (03/01 0434) Last BM Date: 07/09/15  Intake/Output from previous day: 02/28 0701 - 03/01 0700 In: -  Out: 350 [Urine:350] Intake/Output this shift:    PE: Gen:  Alert, NAD, pleasant Card:  RRR, no M/G/R heard Pulm:  CTA, no W/R/R; IS pulled 1750 Abd: Soft, nontender, mild distention +BS, no HSM, no abdominal scars noted Ext:  No erythema, edema, or tenderness  Lab Results:   Recent Labs  06/12/15 1738 06/13/15 0501  WBC 16.1* 11.5*  HGB 17.2* 13.5  HCT 50.6 40.4  PLT 161 135*   BMET  Recent Labs  06/12/15 1738 06/13/15 0501  NA 137 135  K 4.3 4.0  CL 98* 102  CO2 25 26  GLUCOSE 137* 116*  BUN 15 11  CREATININE 1.29* 1.16  CALCIUM 9.6 8.1*   PT/INR No results for input(s): LABPROT, INR in the last 72 hours. CMP     Component Value Date/Time   NA 135 06/13/2015 0501   K 4.0 06/13/2015 0501   CL 102 06/13/2015 0501   CO2 26 06/13/2015 0501   GLUCOSE 116* 06/13/2015 0501   BUN 11 06/13/2015 0501   CREATININE 1.16 06/13/2015 0501   CALCIUM 8.1* 06/13/2015 0501   PROT 5.8* 06/13/2015 0501   ALBUMIN 3.3* 06/13/2015 0501   AST 30 06/13/2015 0501   ALT 43 06/13/2015 0501   ALKPHOS 34* 06/13/2015 0501   BILITOT 1.2 06/13/2015 0501   GFRNONAA >60 06/13/2015 0501   GFRAA >60 06/13/2015 0501   Lipase  No results found for: LIPASE  Studies/Results: No results found.  Anti-infectives: Anti-infectives    Start     Dose/Rate Route Frequency Ordered Stop   06/13/15 1900  cefTRIAXone (ROCEPHIN) 1 g in dextrose 5 %  50 mL IVPB     1 g 100 mL/hr over 30 Minutes Intravenous Every 24 hours 06/12/15 1822     06/13/15 1900  azithromycin (ZITHROMAX) 500 mg in dextrose 5 % 250 mL IVPB     500 mg 250 mL/hr over 60 Minutes Intravenous Every 24 hours 06/12/15 1822     06/12/15 1815  cefTRIAXone (ROCEPHIN) 1 g in dextrose 5 % 50 mL IVPB     1 g 100 mL/hr over 30 Minutes Intravenous  Once 06/12/15 1813 06/12/15 1959   06/12/15 1815  azithromycin (ZITHROMAX) 500 mg in dextrose 5 % 250 mL IVPB     500 mg 250 mL/hr over 60 Minutes Intravenous  Once 06/12/15 1813 06/12/15 2109       Assessment/Plan Fall Multiple left rib fxs -- Pulmonary toilet PNA -- On Zithromax/Rocephin D3 empiric. He's not coughing so a sputum sample is not possible. Afebrile. FEN -- Encouraged more frequent but lower dose narcotics; continue stool softeners VTE -- SCD's, Lovenox  Dispo -- PT/OT eval, pain control. Home once PT clears and oxygen dependency resolved.    LOS: 3 days    Lehman Prom, PA-SII  06/15/2015, 8:38 AM Pager: [phone]

## 2015-06-15 NOTE — Care Management Note (Signed)
Case Management Note  Patient Details  Name: COURTENAY CREGER MRN: [id number] Date of Birth: 05-18-1960  Subjective/Objective:     Pt admitted on 06/12/15 s/p fall down stairs onto concrete with fx ribs and hypoxia.  PTA, pt resided at home and was independent.  PT recommending HHPT at dc.                 Action/Plan: Met with pt to discuss dc planning.  Pt states he needs medical records for his insurance.  Pt given release of medical information form to complete and instructions on how to obtain records.  Pt states he will have "some help, depending on when I'm discharged."  Pt aware he may be dc'd on 06/16/15.  Discussed possibly arranging [place] follow up at dc, as recommended by therapist.  Pt states he needs to think on it overnight, and that his son is studying PT at school, and may be able to provide this service.  Will follow up with pt upon dc.    Expected Discharge Date:    06/16/15              Expected Discharge Plan:   Home with [facility] services  In-House Referral:     Discharge planning Services   CM referral  Post Acute Care Choice:    Choice offered to:     DME Arranged:    DME Agency:     HH Arranged:    HH Agency:     Status of Service:   In process, will continue to follow  Medicare Important Message Given:    Date Medicare IM Given:    Medicare IM give by:    Date Additional Medicare IM Given:    Additional Medicare Important Message give by:     If discussed at [place] of Stay Meetings, dates discussed:    Additional Comments:  Reinaldo Raddle, RN, BSN  Trauma/Neuro ICU Case Manager [phone]

## 2015-06-16 MED ORDER — NAPROXEN 500 MG PO TABS: 500.0000 mg | ORAL_TABLET | Freq: Two times a day (BID) | ORAL | Status: DC

## 2015-06-16 MED ORDER — AZITHROMYCIN 500 MG PO TABS
500.0000 mg | ORAL_TABLET | Freq: Every day | ORAL | Status: DC
  Administered 2015-06-16: 500 mg via ORAL
  Filled 2015-06-16 (×2): qty 1

## 2015-06-16 MED ORDER — DOCUSATE SODIUM 100 MG PO CAPS: 100.0000 mg | ORAL_CAPSULE | Freq: Two times a day (BID) | ORAL | Status: DC

## 2015-06-16 MED ORDER — POLYETHYLENE GLYCOL 3350 17 G PO PACK: 17.0000 g | PACK | Freq: Every day | ORAL | Status: DC

## 2015-06-16 MED ORDER — DEXTROSE 5 % IV SOLN
1.0000 g | INTRAVENOUS | Status: DC
  Administered 2015-06-16: 1 g via INTRAVENOUS
  Filled 2015-06-16 (×2): qty 10

## 2015-06-16 MED ORDER — OXYCODONE-ACETAMINOPHEN 10-325 MG PO TABS: 1.0000 | ORAL_TABLET | ORAL | Status: DC | PRN

## 2015-06-16 NOTE — Discharge Summary (Signed)
Physician Discharge Summary  Patient ID: Manuel Singleton MRN: [id number] DOB/AGE: [birth year] 53 y.o.  Admit date: 06/12/2015 Discharge date: 06/16/2015  Discharge Diagnoses Patient Active Problem List   Diagnosis Date Noted  . Fall 06/13/2015  . Pneumonia 06/13/2015  . Left rib fracture 06/12/2015    Consultants None   Procedures None   HPI: Tom fell down three stairs and landed on concrete about 48 hours prior to admission.He had severe left chest pain and had been in bed since that time.He had to urinate in a bottle and had not had a bowel movement since that time. EMS was called because of fever and shortness of breath.He was found to be hypoxic.He had some PRN Vicodin and Percocet, but this was not able to control his pain.He underwent CT scans of the chest, abdomen, and pelvis which showed the rib fractures and a likely pneumonia. He was admitted to the trauma service and started on antibiotics.   Hospital Course: The patient complete a 5 day course of empiric antibiotics; he never manifested any symptoms other than some low-grade fevers and hypoxia. This had resolved by the time of discharge. He was mobilized with physical therapy and made slow progress but was able to manage independently in the end. His pain was controlled on oral medications and he was discharged home in good condition.     Medication List    STOP taking these medications        HYDROcodone-acetaminophen 5-325 MG tablet  Commonly known as:  NORCO/VICODIN     ibuprofen 200 MG tablet  Commonly known as:  ADVIL,MOTRIN     oxyCODONE-acetaminophen 5-325 MG tablet  Commonly known as:  PERCOCET/ROXICET  Replaced by:  oxyCODONE-acetaminophen 10-325 MG tablet      TAKE these medications        docusate sodium 100 MG capsule  Commonly known as:  COLACE  Take 1 capsule (100 mg total) by mouth 2 (two) times daily.     naproxen 500 MG tablet  Commonly known as:  NAPROSYN  Take 1 tablet (500 mg  total) by mouth 2 (two) times daily with a meal.     oxyCODONE-acetaminophen 10-325 MG tablet  Commonly known as:  PERCOCET  Take 1-2 tablets by mouth every 4 (four) hours as needed for pain.     polyethylene glycol packet  Commonly known as:  MIRALAX / GLYCOLAX  Take 17 g by mouth daily.            Follow-up Information    Call [facility].   Why:  As needed   Contact information:   [address] [id number] [place] [phone]       Signed: Lisette Abu, PA-C Pager: [phone] General Trauma PA Pager: [phone] 06/16/2015, 7:54 AM

## 2015-06-16 NOTE — Progress Notes (Signed)
Physical Therapy Treatment Patient Details Name: Manuel Singleton MRN: [id number] DOB: 1960/08/07 Today's Date: 06/16/2015    History of Present Illness HPI: This is a 55 yo male who fell down three stairs and landed on concrete about 48 hours prior to admission; L rib fxs and hypoxia; PMH: HTN    PT Comments    Pt performed increased gait distance and successful completion of stair training.  Pt performed with cues for safety remains guarded and extremely slow moving secondary to pain.  Pt educated on continued mobility to improve gait quality and strength at d/c.    Follow Up Recommendations  Home health PT     Equipment Recommendations  Other (comment)    Recommendations for Other Services       Precautions / Restrictions Precautions Precautions: None Restrictions Weight Bearing Restrictions: No    Mobility  Bed Mobility Overal bed mobility:  (Pt received in recliner chair on arrival.  )                Transfers Overall transfer level: Needs assistance Equipment used: None   Sit to Stand: Supervision         General transfer comment: Remains to require increased time for sit to stand.  Pt slow moving guarding rib cage secondary to pain.    Ambulation/Gait Ambulation/Gait assistance: Supervision Ambulation Distance (Feet): 113 Feet Assistive device: None Gait Pattern/deviations: Decreased step length - right;Decreased step length - left;Wide base of support Gait velocity: extremely slow   General Gait Details: Slow gait, pt present with no reciprocal armswing secondary to pain and guarding rib cage.  Pt performed increased gait on RA.     Stairs Stairs: Yes   Stair Management: One rail Right Number of Stairs: 10 General stair comments: Pt performed flight of stairs in prep for d/c home to assess safety.  Pt performed forward non-reciprocal pattern to ascend/descend stairs.  pt forward to ascend and backwards to descend.  pt required cues for hand  placement and sequencing.    Wheelchair Mobility    Modified Rankin (Stroke Patients Only)       Balance                                    Cognition Arousal/Alertness: Awake/alert Behavior During Therapy: WFL for tasks assessed/performed Overall Cognitive Status: Within Functional Limits for tasks assessed                      Exercises      General Comments        Pertinent Vitals/Pain Pain Assessment: 0-10 Pain Score: 10-Worst pain ever Pain Location: L side of ribs, pt placed pressure with L hand during gait training.   Pain Intervention(s): Repositioned;Patient requesting pain meds-RN notified    Home Living                      Prior Function            PT Goals (current goals can now be found in the care plan section) Acute Rehab PT Goals Patient Stated Goal: less pain Potential to Achieve Goals: Good Progress towards PT goals: Progressing toward goals    Frequency  Min 6X/week    PT Plan Current plan remains appropriate    Co-evaluation             End of Session Equipment Utilized  During Treatment: Gait belt Activity Tolerance: Patient limited by pain Patient left: in chair;with call bell/phone within reach     Time: 0825-0849 PT Time Calculation (min) (ACUTE ONLY): 24 min  Charges:  $Gait Training: 23-37 mins                    G Codes:      Cristela Blue 07-16-2015, 8:59 AM  Governor Rooks, PTA pager [phone]

## 2015-06-16 NOTE — Care Management Note (Signed)
Case Management Note  Patient Details  Name: Manuel Singleton MRN: [id number] Date of Birth: 16-Mar-1961  Subjective/Objective:    Pt for dc home today; agreeable to follow up with [facility] therapy.                  Action/Plan: Referral to [facility], per pt choice.  Start of care 24-48h post dc date.  No DME needs.    Expected Discharge Date:     06/16/15             Expected Discharge Plan:  [place]  In-House Referral:     Discharge planning Services  CM Consult  Post Acute Care Choice:    Choice offered to:  Patient  DME Arranged:  N/A DME Agency:     HH Arranged:  PT [place] Agency:  [place]  Status of Service:  Completed, signed off  Medicare Important Message Given:    Date Medicare IM Given:    Medicare IM give by:    Date Additional Medicare IM Given:    Additional Medicare Important Message give by:     If discussed at [place] of Stay Meetings, dates discussed:    Additional Comments:  Reinaldo Raddle, RN, BSN  Trauma/Neuro ICU Case Manager [phone]

## 2015-06-16 NOTE — Discharge Instructions (Signed)
Use magnesium citrate if you need a stronger laxative.  Increase activity as pain allows.  No driving while taking oxycodone.

## 2015-06-16 NOTE — Progress Notes (Signed)
Patient is being discharged.  Reviewed discharge paperwork/medications with patient.  Patient worked well with PT today. I contacted medical records for the patient and faxed them the release form he filled out.  They will mail the patient his records.  Patient states his ride will not be here to get him until 1600 or 1700.  Ilene Qua 12:22 PM 06/16/2015

## 2015-06-16 NOTE — Progress Notes (Signed)
Patient ID: Manuel Singleton, male   DOB: [birth year], 55 y.o.   MRN: [id number]   LOS: 4 days   Subjective: No new c/o.   Objective: Vital signs in last 24 hours: Temp:  [98.7 F (37.1 C)-99.5 F (37.5 C)] 99.5 F (37.5 C) (03/02 0439) Pulse Rate:  [77-93] 77 (03/02 0439) Resp:  [16-18] 18 (03/02 0439) BP: (138-151)/(75-84) 138/81 mmHg (03/02 0439) SpO2:  [94 %-95 %] 95 % (03/02 0439) Last BM Date: 06/11/15   IS: 1783ml (+579ml)   Physical Exam General appearance: alert and no distress Resp: clear to auscultation bilaterally Cardio: regular rate and rhythm GI: normal findings: bowel sounds normal and soft, non-tender   Assessment/Plan: Fall Multiple left rib fxs -- Pulmonary toilet PNA -- On Zithromax/Rocephin D5/5 empiric. He's not coughing so a sputum sample is not possible. Afebrile. FEN -- Encouraged more frequent but lower dose narcotics; continue stool softeners VTE -- SCD's, Lovenox  Dispo -- Home today after PT    Lisette Abu, PA-C Pager: [phone] General Trauma PA Pager: [phone]  06/16/2015

## 2015-06-17 LAB — CULTURE, BLOOD (ROUTINE X 2)
Culture: NO GROWTH
Culture: NO GROWTH

## 2015-06-21 ENCOUNTER — Telehealth (HOSPITAL_COMMUNITY): Payer: Self-pay

## 2015-06-21 NOTE — Telephone Encounter (Signed)
Informed pt he had finished a full course while he was admitted and didn't any antibiotics at discharge.

## 2015-06-23 ENCOUNTER — Telehealth (HOSPITAL_COMMUNITY): Payer: Self-pay

## 2015-06-23 NOTE — Telephone Encounter (Signed)
Referred him to [facility] to have forms filled out.

## 2015-06-28 ENCOUNTER — Telehealth (HOSPITAL_COMMUNITY): Payer: Self-pay

## 2015-06-28 NOTE — Telephone Encounter (Signed)
Refilled generic percocet 10mg /325mg .  30 tablets.  Take 1 tab every 6-8 hours as needed for pain.  Told him I would not expect he would need another refill.  Encouraged ibuprofen (with food), and tylenol PRN after this prescription is done.  He is understanding and will come to the ER to be directed to the trauma office to pick up his script.

## 2015-07-15 ENCOUNTER — Telehealth: Payer: Self-pay | Admitting: Orthopedic Surgery

## 2015-07-15 NOTE — Telephone Encounter (Signed)
Manuel Singleton called needing RTW determination. He states he's better but nowhere near ready. He is written out until 4/21. I scheduled him an appt on 4/19.

## 2015-08-11 ENCOUNTER — Telehealth (HOSPITAL_COMMUNITY): Payer: Self-pay

## 2015-08-11 NOTE — Telephone Encounter (Signed)
[facility] to make referral to PT and contact pt to tell him.

## 2015-08-15 ENCOUNTER — Telehealth (HOSPITAL_COMMUNITY): Payer: Self-pay

## 2015-08-15 NOTE — Telephone Encounter (Signed)
Manuel Singleton called enquiring about PT. A referral was made by [facility] last Thursday but apparently are running 1-2 weeks out in making appts. Will let pt know and extend time off by 2 weeks.

## 2015-08-23 ENCOUNTER — Ambulatory Visit: Payer: Commercial Managed Care - HMO | Attending: Orthopedic Surgery

## 2015-08-23 DIAGNOSIS — M546 Pain in thoracic spine: Secondary | ICD-10-CM | POA: Insufficient documentation

## 2015-08-23 DIAGNOSIS — R293 Abnormal posture: Secondary | ICD-10-CM | POA: Diagnosis present

## 2015-08-23 NOTE — Patient Instructions (Signed)
Thoracolumbar Side-Bend: Arms Folded (Standing)     To the Right.  Hold 10 seconds.  Repeat 5 reps.  Do this 3x/day.   Right sidelying, reach your Left arm overhead and hold 10 seconds.  Do 5.  3x/day.     PNF Strengthening: Resisted   Standing with resistive band around each hand, bring right arm up and away, thumb back. Repeat _10___ times per set. Do _2___ sets per session. Do _1-2___ sessions per day.      Resisted Horizontal Abduction: Bilateral   Sit or stand, tubing in both hands, arms out in front. Keeping arms straight, pinch shoulder blades together and stretch arms out. Repeat _10___ times per set. Do 2____ sets per session. Do _1-2___ sessions per day.   Scapular Retraction: Elbow Flexion (Standing)   With elbows bent to 90, pinch shoulder blades together and rotate arms out, keeping elbows bent. Repeat _10___ times per set. Do _1___ sets per session. Do many____ sessions per day.    Posture - Standing   Good posture is important. Avoid slouching and forward head thrust. Maintain curve in low back and align ears over shoulders, hips over ankles.  Pull your belly button in toward your back bone. Posture Tips DO: - stand tall and erect - keep chin tucked in - keep head and shoulders in alignment - check posture regularly in mirror or large window - pull head back against headrest in car seat;  Change your position often.  Sit with lumbar support. DON'T: - slouch or slump while watching TV or reading - sit, stand or lie in one position  for too long;  Sitting is especially hard on the spine so if you sit at a desk/use the computer, then stand up often! Copyright  VHI. All rights reserved.  Posture - Sitting  Sit upright, head facing forward. Try using a roll to support lower back. Keep shoulders relaxed, and avoid rounded back. Keep hips level with knees. Avoid crossing legs for long periods. Copyright  VHI. All rights reserved.  Chronic neck strain can  develop because of poor posture and faulty work habits  Postural strain related to slumped sitting and forward head posture is a leading cause of headaches, neck and upper back pain  General strengthening and flexibility exercises are helpful in the treatment of neck pain.  Most importantly, you should learn to correct the posture that may be contributing to chronic pain.   Change positions frequently  Change your work or home environment to improve posture and mechanics.   [employer] [address], [place], [place] [address] Phone # [phone] Fax [phone]

## 2015-08-23 NOTE — Therapy (Signed)
[facility] [address] [place], [place], [address] Phone: [phone]   Fax:  [phone]  Physical Therapy Evaluation  Patient Details  Name: Manuel Singleton MRN: [id number] Date of Birth: May 16, 1960 Referring Provider: Silvestre Gunner, [facility]  Encounter Date: 08/23/2015      PT End of Session - 08/23/15 1228    Visit Number 1   Date for PT Re-Evaluation 10/18/15   PT Start Time 1146   PT Stop Time 1225   PT Time Calculation (min) 39 min   Activity Tolerance Patient tolerated treatment well   Behavior During Therapy [facility] for tasks assessed/performed      Past Medical History  Diagnosis Date  . Hypertension     Past Surgical History  Procedure Laterality Date  . Tonsillectomy      There were no vitals filed for this visit.       Subjective Assessment - 08/23/15 1148    Subjective Pt presents to PT  ~10 weeks s/p Lt rib fractures (7).  Pt tripped over a cord on his porch and fell off leading to rib fractures.  Pt has not returned to work yet and will return in 2 weeks unless more is needed.     Pertinent History Lt rib fractures (7 ribs)-2/24//17   Diagnostic tests x-ray: complete 06/10/15.  No recent imaging since injury   Patient Stated Goals return to work, reduce pain in Lt ribs   Currently in Pain? Yes   Pain Score 0-No pain  with sleep 1-2/10   Pain Location Rib cage   Pain Orientation Left   Pain Descriptors / Indicators Sore   Pain Type Acute pain   Pain Onset More than a month ago   Pain Frequency Intermittent   Aggravating Factors  laying on Lt side   Pain Relieving Factors getting off of Lt side            [facility] PT Assessment - 08/23/15 0001    Assessment   Medical Diagnosis multiple fractures of the ribs, Lt side (S22.42XS)   Referring Provider Silvestre Gunner, [facility]   Onset Date/Surgical Date 06/10/15   Next MD Visit none   Prior Therapy none   Precautions   Precautions None    Precaution Comments Rib fractures 06/10/15   Restrictions   Weight Bearing Restrictions No   Balance Screen   Has the patient fallen in the past 6 months No   Has the patient had a decrease in activity level because of a fear of falling?  No   Is the patient reluctant to leave their home because of a fear of falling?  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  Lt rib "discomfort"  with Rt sidebending   PROM   Overall PROM  Within functional limits for tasks performed   Strength   Overall Strength Within functional limits for tasks performed   Overall Strength Comments 5/5 UE strength throughout   Palpation   Spinal mobility reduced PA mobility in thoracic spine without pain, reduced rib mobility on the Lt.   Palpation comment Mild palpable tenderness over Lt lateral rib cage.     Ambulation/Gait   Ambulation/Gait Yes   Ambulation/Gait Assistance 7: Independent                           PT Education - 08/23/15 1217    Education provided Yes   Education Details posture education, Lt thoracic stretch, horizontal abduction   Person(s) Educated Patient   Methods  Explanation;Demonstration;Handout   Comprehension Verbalized understanding;Returned demonstration          PT Short Term Goals - 08/23/15 1200    PT SHORT TERM GOAL #1   Title be independent in initial HEP   Time 4   Period Weeks   Status New   PT SHORT TERM GOAL #2   Title demonstrate good seated posture and report postural corrections with sitting and standing tasks   Time 4   Period Weeks   Status New   PT SHORT TERM GOAL #3   Title sleep on Lt side at night 50% of the time without limitation   Time 4   Period Weeks   Status New           PT Long Term Goals - 08/23/15 1144    PT LONG TERM GOAL #1   Title be independent in advanced HEP   Time 8   Period Weeks   Status New   PT LONG TERM GOAL #2   Title reduce FOTO to < or = to 24% limitation   Time 8   Period Weeks   Status New   PT LONG TERM GOAL #3   Title sleep on Lt side > or = to 75% of the time without limitation due to pain   Time 8   Period Weeks   Status New   PT LONG TERM GOAL #4   Title return to regular weight lifting with reduced weight without increased pain   Time 8   Period Weeks   Status New               Plan - 08/23/15 1218    Clinical Impression Statement Pt presents to PT 10 weeks s/p Lt rib fractures due to fall.  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  MD would like pt to have PT to help reduce pain and improve mobiltiy and strength needed for return to work.  Pt with 2/10 Lt rib pain with sleep at night on Lt side, postural abnormality, postural weakness and reduced rib mobilty on the Lt.  Pt will benefit from skilled PT for postural strength, reeducation, manual and modalities as needed.     Rehab Potential Good   PT Frequency 2x / week   PT Duration 8 weeks   PT Treatment/Interventions ADLs/Self Care Home Management;Cryotherapy;Electrical Stimulation;Moist Heat;Therapeutic exercise;Therapeutic activities;Functional mobility training;Ultrasound;Neuromuscular  re-education;Patient/family education;Manual techniques;Taping;Dry needling;Passive range of motion   PT Next Visit Plan Postural strength, flexibility for Lt ribs/thoracic spine, thoracic mobs, modalities as needed.   Consulted and Agree with Plan of Care Patient      Patient  will benefit from skilled therapeutic intervention in order to improve the following deficits and impairments:  Postural dysfunction, Decreased strength, Improper body mechanics, Pain, Decreased activity tolerance, Decreased endurance  Visit Diagnosis: Pain in thoracic spine - Plan: PT plan of care cert/re-cert  Abnormal posture - Plan: PT plan of care cert/re-cert     Problem List Patient Active Problem List   Diagnosis Date Noted  . Fall 06/13/2015  . Pneumonia 06/13/2015  . Left rib fracture 06/12/2015     Sigurd Sos, PT 08/23/2015 12:33 PM  [facility] [address], [place], [place], [address] Phone: [phone]   Fax:  [phone]  Name: Manuel Singleton MRN: [id number] Date of Birth: Sep 13, 1960

## 2015-08-30 ENCOUNTER — Ambulatory Visit: Payer: Commercial Managed Care - HMO | Admitting: Physical Therapy

## 2015-08-30 ENCOUNTER — Encounter: Payer: Self-pay | Admitting: Physical Therapy

## 2015-08-30 DIAGNOSIS — M546 Pain in thoracic spine: Secondary | ICD-10-CM

## 2015-08-30 DIAGNOSIS — R293 Abnormal posture: Secondary | ICD-10-CM

## 2015-08-30 NOTE — Patient Instructions (Signed)
  Cervico-Thoracic: Extension / Rotation (Sitting)   Reach across body with left arm and grasp back of chair. Gently look over right side shoulder. Hold __20__ seconds. Relax. Repeat ___3_ times per set. Do _1___ sets per session. Do __3__ sessions per day.  Copyright  VHI. All rights reserved.     Lumbar Rotation: Caudal - Bilateral (Supine)   Feet and knees together, arms outstretched, rotate knees left, turning head in opposite direction, until stretch is felt. Hold _20___ seconds. Relax. Repeat __3__ times per set. Do _1___ sets per session. Do _3___ sessions per day.  http://orth.exer.us/1020   Copyright  VHI. All rights reserved.  Thoracic Self-Mobilization (Sitting)   With small rolled towel at lower ribs level, gently lean back until stretch is felt.   Relax. Repeat  10____ times per set.  Do __2-3__ sessions per day.  http://orth.exer.us/998   Copyright  VHI. All rights reserved.  Thoracic Self-Mobilization Stretch (Supine)   With small rolled towel at lower ribs level, gently lie back until stretch is felt. Hold 2 - 3 min. Relax. Repeat __1__ times per set. Do __1__ sets per session. Do __1-2__ sessions per day. HAVE YOUR ARMS ON THE SIDE.  Towel in Bra area   http://orth.exer.us/994   Copyright  VHI. All rights reserved.

## 2015-08-30 NOTE — Therapy (Signed)
[facility] [address] [place], [place], [address] Phone: [phone]   Fax:  [phone]  Physical Therapy Treatment  Patient Details  Name: Manuel Singleton MRN: [id number] Date of Birth: 1960/07/01 Referring Provider: Silvestre Gunner, [facility]  Encounter Date: 08/30/2015      PT End of Session - 08/30/15 0955    Visit Number 2   Date for PT Re-Evaluation 10/18/15   PT Start Time 0929   PT Stop Time [id number]   PT Time Calculation (min) 46 min   Activity Tolerance Patient tolerated treatment well   Behavior During Therapy [facility] for tasks assessed/performed      Past Medical History  Diagnosis Date  . Hypertension     Past Surgical History  Procedure Laterality Date  . Tonsillectomy      There were no vitals filed for this visit.      Subjective Assessment - 08/30/15 0954    Subjective Pt reports with pressure on left ribcage he feels discomfort or with certain movements.    Pertinent History Lt rib fractures (7 ribs)-2/24//17. Pt tripped over a cord on a porch leading to rib fracture.    Diagnostic tests x-ray: complete 06/10/15.  No recent imaging since injury   Patient Stated Goals return to work, reduce pain in Lt ribs   Currently in Pain? No/denies                         [facility] Adult PT Treatment/Exercise - 08/30/15 0001    Exercises   Exercises Shoulder;Neck   Neck Exercises: Machines for Strengthening   UBE (Upper Arm Bike) L 3 x 2min (3/3), 21min 2.104mph   Lumbar Exercises: Stretches   Lower Trunk Rotation 3 reps;20 seconds  each side    Shoulder Exercises: Supine   Other Supine Exercises Selfmob with towelroll x 3 min   Other Supine Exercises Fom roll x 3 min, overhead flexion, snowangel x 10   Shoulder Exercises: Standing   Other Standing Exercises --   Shoulder Exercises: Stretch   Other Shoulder Stretches 12 o'clock to Lt side x 5 pt with improvment after stretching  activities                PT Education - 08/30/15 1006    Education provided Yes   Education Details trunk rotation sitting and supine, selfmob with towel roll, extension in chair    Person(s) Educated Patient   Methods Explanation;Demonstration;Handout   Comprehension Verbalized understanding;Returned demonstration          PT Short Term Goals - 08/30/15 1016    PT SHORT TERM GOAL #1   Title be independent in initial HEP   Time 4   Period Weeks   Status On-going   PT SHORT TERM GOAL #2   Title demonstrate good seated posture and report postural corrections with sitting and standing tasks   Time 4   Period Weeks   Status On-going   PT SHORT TERM GOAL #3   Title sleep on Lt side at night 50% of the time without limitation   Time 4   Period Weeks   Status On-going           PT Long Term Goals - 08/23/15 1144    PT LONG TERM GOAL #1   Title be independent in advanced HEP   Time 8   Period Weeks   Status New   PT LONG TERM GOAL #2  Title reduce FOTO to < or = to 24% limitation   Time 8   Period Weeks   Status New   PT LONG TERM GOAL #3   Title sleep on Lt side > or = to 75% of the time without limitation due to pain   Time 8   Period Weeks   Status New   PT LONG TERM GOAL #4   Title return to regular weight lifting with reduced weight without increased pain   Time 8   Period Weeks   Status New               Plan - 08/30/15 0957    Clinical Impression Statement Pt with slighly restriction in left ribcage area, due to fascial and muscle restriction. pt with improved flexibility after stretching exercises going into 12 o'clock on left side. Pt will continue to benefit from skilled PT to address limitations.    Rehab Potential Good   PT Frequency 2x / week   PT Duration 8 weeks   PT Treatment/Interventions ADLs/Self Care Home Management;Cryotherapy;Electrical Stimulation;Moist Heat;Therapeutic exercise;Therapeutic activities;Functional  mobility training;Ultrasound;Neuromuscular re-education;Patient/family education;Manual techniques;Taping;Dry needling;Passive range of motion   PT Next Visit Plan Review selfmob in supine and sitting, wall clock 12 o'clock, continue fllexibility for Lt ribs/thoracic spine, thoracic mobs, modalities as needed.   Consulted and Agree with Plan of Care Patient      Patient will benefit from skilled therapeutic intervention in order to improve the following deficits and impairments:  Postural dysfunction, Decreased strength, Improper body mechanics, Pain, Decreased activity tolerance, Decreased endurance  Visit Diagnosis: Pain in thoracic spine  Abnormal posture     Problem List Patient Active Problem List   Diagnosis Date Noted  . Fall 06/13/2015  . Pneumonia 06/13/2015  . Left rib fracture 06/12/2015    NAUMANN-HOUEGNIFIO,Manuel Singleton PTA 08/30/2015, 10:24 AM  [facility] [address], [place], [place], [address] Phone: [phone]   Fax:  [phone]  Name: Manuel Singleton MRN: [id number] Date of Birth: 25-Feb-1961

## 2015-09-01 ENCOUNTER — Encounter: Payer: Self-pay | Admitting: Physical Therapy

## 2015-09-01 ENCOUNTER — Ambulatory Visit: Payer: Commercial Managed Care - HMO | Admitting: Physical Therapy

## 2015-09-01 DIAGNOSIS — M546 Pain in thoracic spine: Secondary | ICD-10-CM | POA: Diagnosis not present

## 2015-09-01 DIAGNOSIS — R293 Abnormal posture: Secondary | ICD-10-CM

## 2015-09-01 NOTE — Therapy (Addendum)
[facility] [address] [place], [place], [address] Phone: [phone]   Fax:  [phone]  Physical Therapy Treatment  Patient Details  Name: Manuel Singleton MRN: [id number] Date of Birth: [birth year] Referring Provider: Silvestre Gunner, [facility]  Encounter Date: 09/01/2015      PT End of Session - 09/01/15 1106    Visit Number 3   Date for PT Re-Evaluation 10/18/15   PT Start Time 2409   PT Stop Time 1059   PT Time Calculation (min) 44 min   Activity Tolerance Patient tolerated treatment well   Behavior During Therapy [facility] for tasks assessed/performed      Past Medical History  Diagnosis Date  . Hypertension     Past Surgical History  Procedure Laterality Date  . Tonsillectomy      There were no vitals filed for this visit.      Subjective Assessment - 09/01/15 1041    Subjective Pt reports the pain in left rib cage is less in intensity and duration since start of PT   Pertinent History Lt rib fractures (7 ribs)-2/24//17. Pt tripped over a cord on a porch leading to rib fracture.    Diagnostic tests x-ray: complete 06/10/15.  No recent imaging since injury   Patient Stated Goals return to work, reduce pain in Lt ribs   Currently in Pain? No/denies                         [facility] Adult PT Treatment/Exercise - 09/01/15 0001    Posture/Postural Control   Posture/Postural Control Postural limitations   Postural Limitations Rounded Shoulders;Forward head;Weight shift right;Flexed trunk   Exercises   Exercises Shoulder;Neck   Neck Exercises: Machines for Strengthening   UBE (Upper Arm Bike) L 3 x 58mn (3/3), 877m 2.7726m  Lumbar Exercises: Stretches   Lower Trunk Rotation 3 reps;20 seconds  each side   Shoulder Exercises: Supine   Other Supine Exercises Selfmob with towelroll x 3 min   Other Supine Exercises Fom roll x 3 min, overhead flexion, snowangel x 10  and dynamic thoracic  flexibility into flex/extension    Shoulder Exercises: Standing   Other Standing Exercises Triangle x 3 to Rt side to open left rib cage   Shoulder Exercises: Stretch   Other Shoulder Stretches 12 o'clock to Lt side x 10 pt with improvment after stretching activities                  PT Short Term Goals - 08/30/15 1016    PT SHORT TERM GOAL #1   Title be independent in initial HEP   Time 4   Period Weeks   Status On-going   PT SHORT TERM GOAL #2   Title demonstrate good seated posture and report postural corrections with sitting and standing tasks   Time 4   Period Weeks   Status On-going   PT SHORT TERM GOAL #3   Title sleep on Lt side at night 50% of the time without limitation   Time 4   Period Weeks   Status On-going           PT Long Term Goals - 08/23/15 1144    PT LONG TERM GOAL #1   Title be independent in advanced HEP   Time 8   Period Weeks   Status New   PT LONG TERM GOAL #2   Title reduce FOTO to < or = to  24% limitation   Time 8   Period Weeks   Status New   PT LONG TERM GOAL #3   Title sleep on Lt side > or = to 75% of the time without limitation due to pain   Time 8   Period Weeks   Status New   PT LONG TERM GOAL #4   Title return to regular weight lifting with reduced weight without increased pain   Time 8   Period Weeks   Status New               Plan - 09/01/15 1106    Clinical Impression Statement Pt improves with his flexibility in left lat trunk side, after stretching and mobilisation exercises continues to improve with stretching exercise going into 12 o'clock. Pt will continue to benefit from skilled PT for stretching and strengthening.    Rehab Potential Good   PT Frequency 2x / week   PT Duration 8 weeks   PT Treatment/Interventions ADLs/Self Care Home Management;Cryotherapy;Electrical Stimulation;Moist Heat;Therapeutic exercise;Therapeutic activities;Functional mobility training;Ultrasound;Neuromuscular  re-education;Patient/family education;Manual techniques;Taping;Dry needling;Passive range of motion   PT Next Visit Plan Review selfmob in supine and sitting, wall clock 12 o'clock, continue fllexibility for Lt ribs/thoracic spine, thoracic mobs, modalities as needed.   Consulted and Agree with Plan of Care Patient      Patient will benefit from skilled therapeutic intervention in order to improve the following deficits and impairments:  Postural dysfunction, Decreased strength, Improper body mechanics, Pain, Decreased activity tolerance, Decreased endurance  Visit Diagnosis: Pain in thoracic spine  Abnormal posture     Problem List Patient Active Problem List   Diagnosis Date Noted  . Fall 06/13/2015  . Pneumonia 06/13/2015  . Left rib fracture 06/12/2015    NAUMANN-HOUEGNIFIO,Delphine Sizemore PTA 09/01/2015, 1:23 PM PHYSICAL THERAPY DISCHARGE SUMMARY  Visits from Start of Care: 3  Current functional level related to goals / functional outcomes: Pt didn't return to PT after visit on 09/01/15.     Remaining deficits: See above for most current status.     Education / Equipment: HEP, posture Plan: Patient agrees to discharge.  Patient goals were not met. Patient is being discharged due to not returning since the last visit.  ?????   Sigurd Sos, PT 10/24/2015 3:44 PM  [facility] [address], [place], [place], [address] Phone: [phone]   Fax:  [phone]  Name: DOSS CYBULSKI MRN: [id number] Date of Birth: 1960/12/14

## 2016-04-30 IMAGING — CT CT CHEST W/O CM
2 of 4 series · 15 of 36 positions shown, 18 images · non-contrast
Comparison: 06/12/2015

CLINICAL DATA: Fall 2 days ago down stairs. Left-sided rib pain.
Chest and back pain. Shortness of breath. Sepsis. Pneumonia.

EXAM:
CT CHEST WITHOUT CONTRAST
TECHNIQUE: Multidetector CT imaging of the chest was performed following the
standard protocol without IV contrast.

[Series 4: chest w/o 1mm st · axial · non-contrast · 0.73mm/px · z∈[-941,-668]mm · 12 of 376 slices shown, 15 images]
[im 17/376  mediastinal]
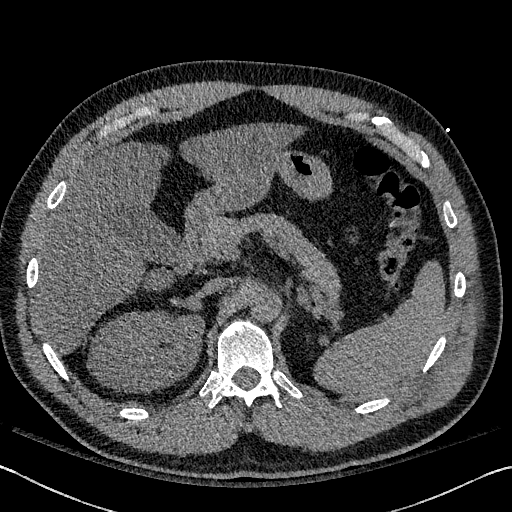
[im 17/376  lung]
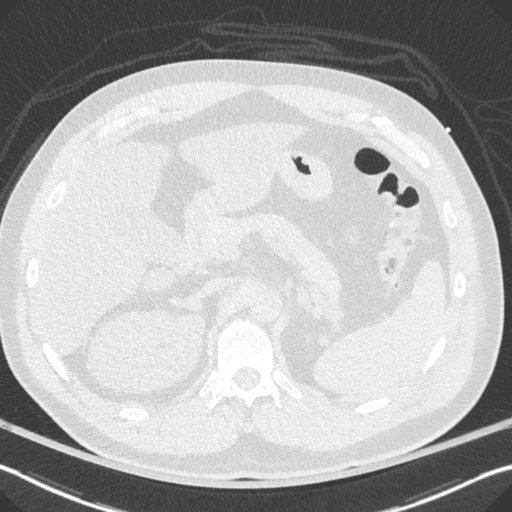
[im 49/376  lung]
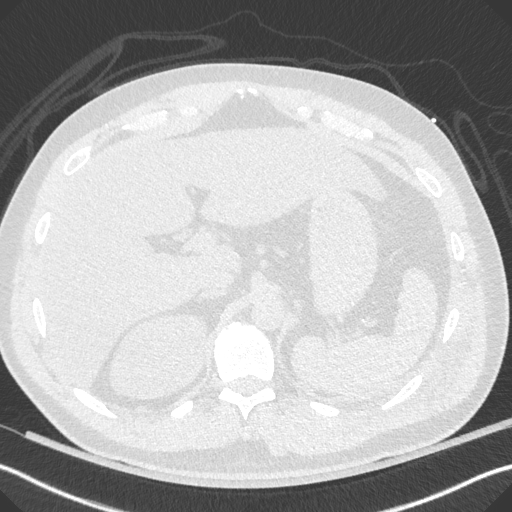
[im 82/376  lung]
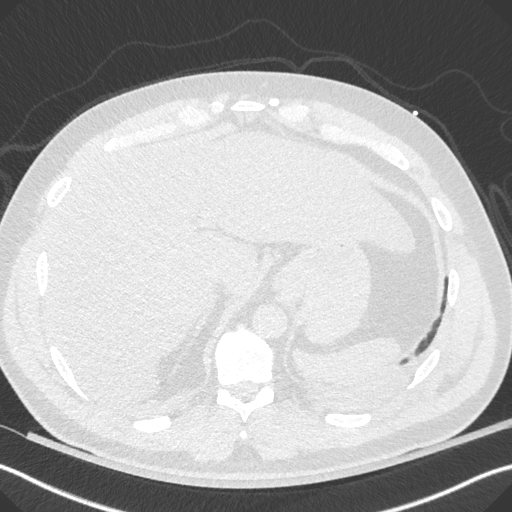
[im 115/376  lung]
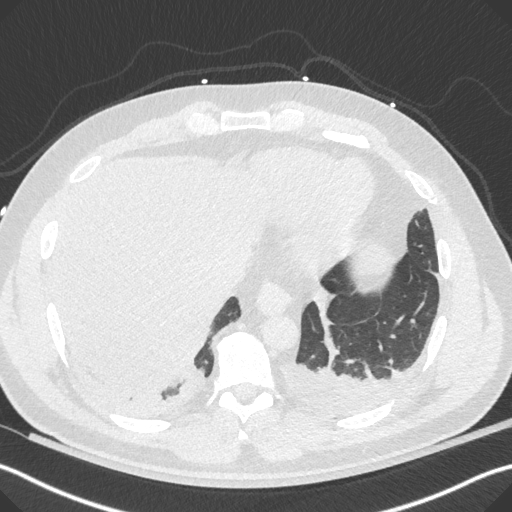
[im 147/376  mediastinal]
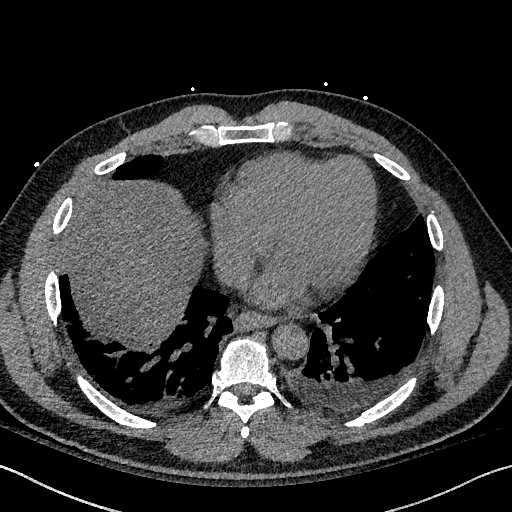
[im 147/376  lung]
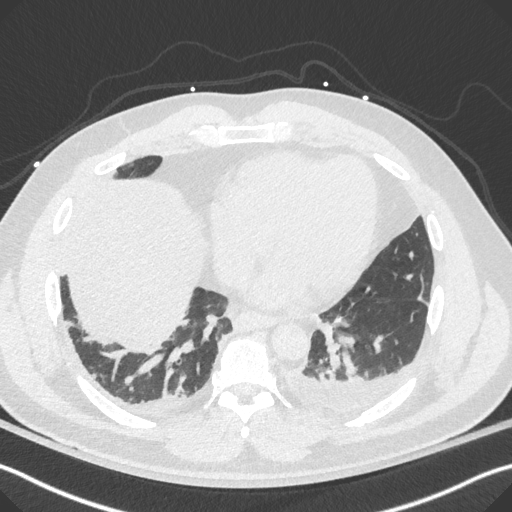
[im 180/376  lung]
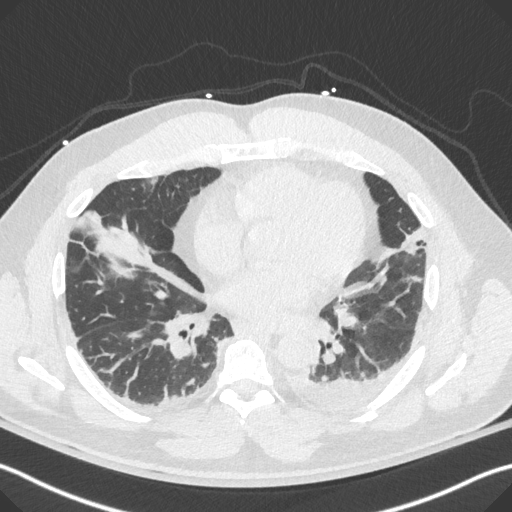
[im 196/376  lung]
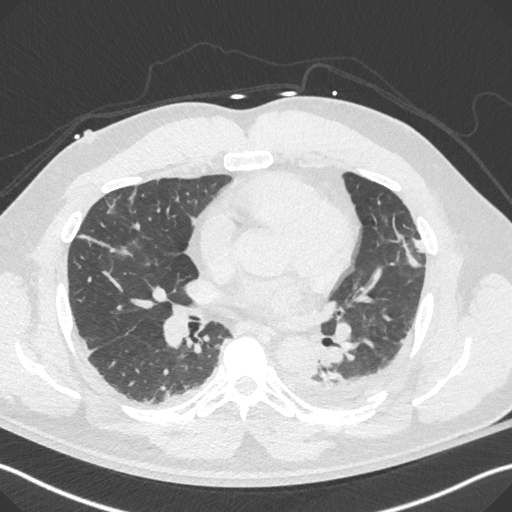
[im 229/376  lung]
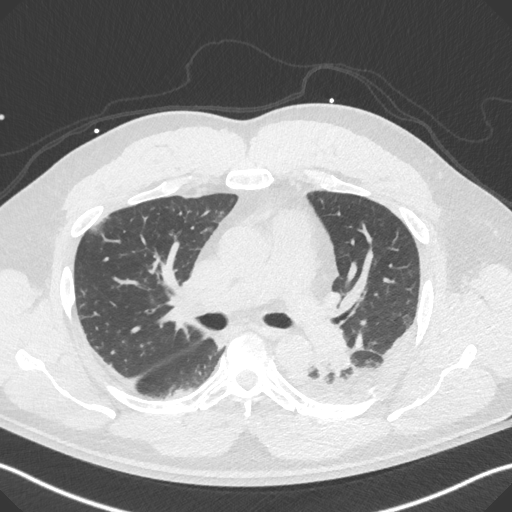
[im 261/376  mediastinal]
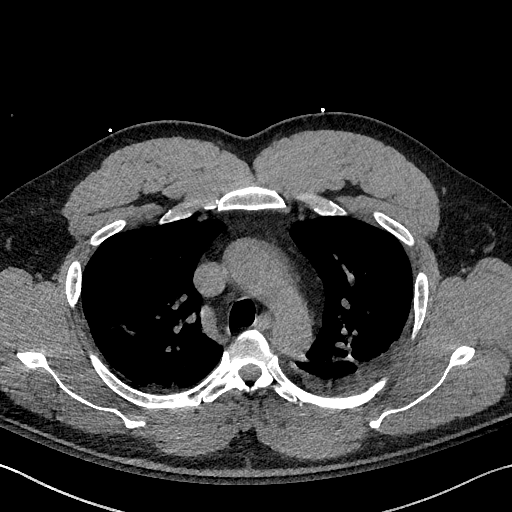
[im 261/376  lung]
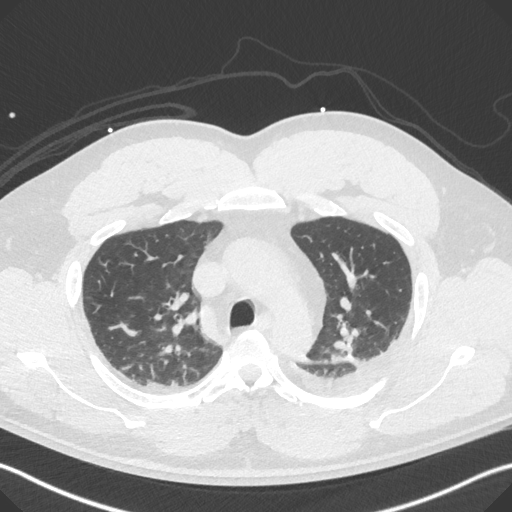
[im 294/376  lung]
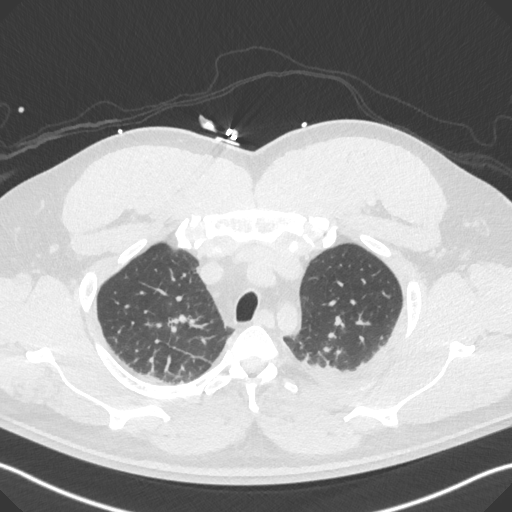
[im 327/376  lung]
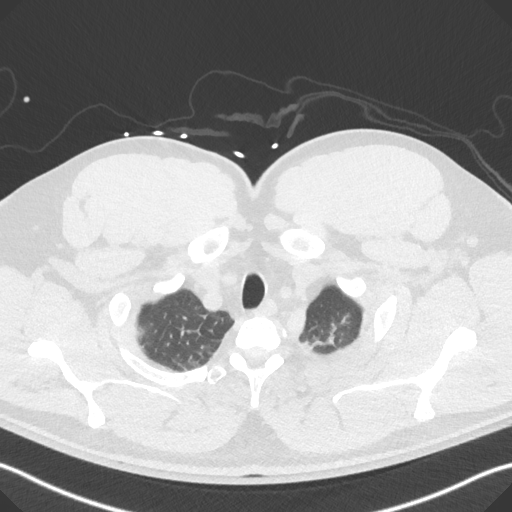
[im 359/376  lung]
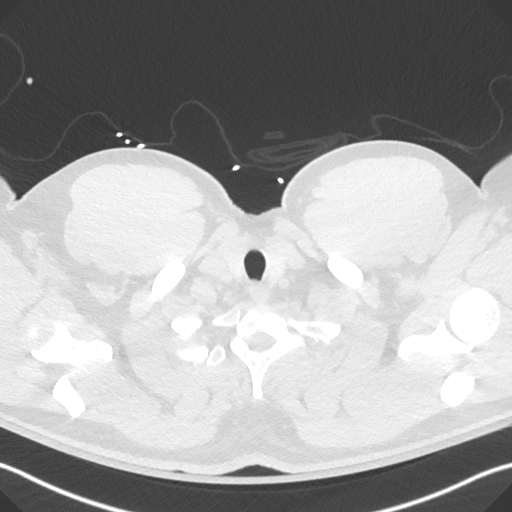

[Series 5: chest w/o 3mm st cor · coronal · non-contrast · 0.58mm/px · 3 of 89 slices shown]
[im 18/89  lung]
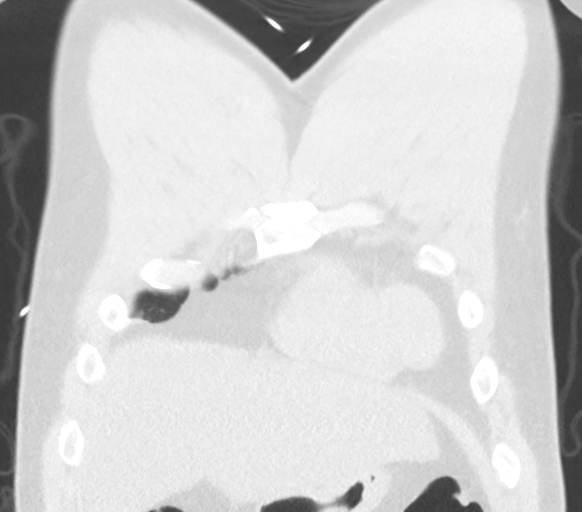
[im 36/89  lung]
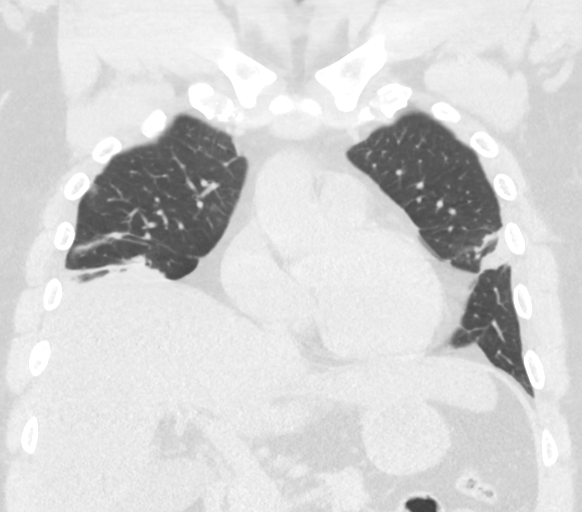
[im 53/89  lung]
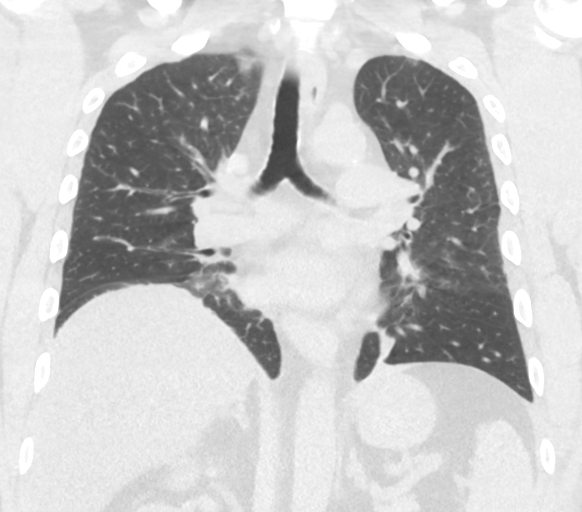

[15 of 36 positions shown; findings below may reference images not displayed]

FINDINGS: Mediastinum/Nodes: Very minimal atherosclerotic calcification of the
aortic arch.

Lungs/Pleura: Small bilateral pleural effusions. There is a small
amount of hematoma along the left pleural space associated with the
various rib fractures.

Indistinct mild airspace opacities are scattered in the right lung.
Suspected mild atelectasis in the lingula. Passive atelectasis in
both lower lobes. No pneumothorax.

Upper abdomen: Hepatic steatosis.

Musculoskeletal: There are anterior fractures of the left third,
fourth, fifth, and sixth ribs which are relatively nondisplaced; and
also fractures of the left fourth, fifth, sixth, seventh, eighth,
and ninth ribs which are variably displaced, with the sixth rib
being the most displaced about 1 bone width with slight overlap.
Despite the presence of the small right pleural effusion, I do not
see a definite right-sided rib fracture.

Note is made of generally hypertrophic musculature in the upper
chest and shoulders.
IMPRESSION: 1. Left anterior third through sixth rib fractures and left
posterior fourth through ninth displaced rib fractures. Small left
pleural effusion with passive atelectasis.
2. There is a is trace right pleural effusion with passive
atelectasis but without definite right-sided rib fractures.
3. There is some scattered due a somewhat nodular airspace opacities
in both lungs potentially pulmonary contusions or multifocal
pneumonia. These are present along with mild scattered atelectasis.
4. Hepatic steatosis.

## 2016-08-09 DIAGNOSIS — M79671 Pain in right foot: Secondary | ICD-10-CM | POA: Diagnosis not present

## 2016-08-09 DIAGNOSIS — M79672 Pain in left foot: Secondary | ICD-10-CM | POA: Diagnosis not present

## 2016-08-09 DIAGNOSIS — E1165 Type 2 diabetes mellitus with hyperglycemia: Secondary | ICD-10-CM | POA: Diagnosis not present

## 2016-08-09 DIAGNOSIS — E119 Type 2 diabetes mellitus without complications: Secondary | ICD-10-CM | POA: Diagnosis not present

## 2017-02-08 ENCOUNTER — Other Ambulatory Visit: Payer: Self-pay | Admitting: *Deleted

## 2017-02-08 ENCOUNTER — Encounter: Payer: Self-pay | Admitting: Neurology

## 2017-02-08 DIAGNOSIS — R200 Anesthesia of skin: Secondary | ICD-10-CM

## 2017-02-08 DIAGNOSIS — R202 Paresthesia of skin: Principal | ICD-10-CM

## 2017-02-08 DIAGNOSIS — E119 Type 2 diabetes mellitus without complications: Secondary | ICD-10-CM | POA: Diagnosis not present

## 2017-02-21 ENCOUNTER — Ambulatory Visit (INDEPENDENT_AMBULATORY_CARE_PROVIDER_SITE_OTHER): Payer: 59 | Admitting: Neurology

## 2017-02-21 ENCOUNTER — Encounter: Payer: Self-pay | Admitting: Neurology

## 2017-02-21 DIAGNOSIS — R200 Anesthesia of skin: Secondary | ICD-10-CM | POA: Diagnosis not present

## 2017-02-21 DIAGNOSIS — R202 Paresthesia of skin: Secondary | ICD-10-CM

## 2017-02-21 DIAGNOSIS — M5417 Radiculopathy, lumbosacral region: Secondary | ICD-10-CM

## 2017-02-21 NOTE — Procedures (Signed)
[facility]  [address], [address]  [place], [place] [address] Tel: [phone] Fax:  [phone] Test Date:  02/21/2017  Patient: Manuel Singleton DOB: 05-31-1960 Physician: Narda Amber, DO  Sex: Male Height: 6\' 2"  Ref Phys: Lavone Orn, MD  ID#: [id number] Temp: 35.0C Technician:    Patient Complaints: This is a 56 year old gentleman referred for evaluation of bilateral foot numbness, worse on the right.  NCV & EMG Findings: Extensive electrodiagnostic testing of the right lower extremity shows:  1. Right sural and superficial peroneal sensory responses are within normal limits. 2. Right peroneal motor responses within normal limits. Right tibial motor response shows reduced amplitude bilaterally (R1.3, L3.6 mV), worse on the right with there is also mild slowed conduction velocity (Knee-Ankle, 38 m/s).   3. Right tibial H reflex is prolonged.  4. Chronic motor axon loss changes are seen affecting the S1 myotomes bilaterally, without accompanied active denervation.   Impression: 1. Chronic S1 radiculopathy affecting bilateral lower extremities, worse on the right. Overall, these findings are mild in degree electrically. 2. There is no evidence of a large fiber sensorimotor polyneuropathy.   ___________________________ Narda Amber, DO    Nerve Conduction Studies Anti Sensory Summary Table   Site NR Peak (ms) Norm Peak (ms) P-T Amp (V) Norm P-T Amp  Right Sup Peroneal Anti Sensory (Ant Lat Mall)  12 cm    3.0 <4.6 8.5 >4  Right Sural Anti Sensory (Lat Mall)  Calf    3.8 <4.6 10.9 >4   Motor Summary Table   Site NR Onset (ms) Norm Onset (ms) O-P Amp (mV) Norm O-P Amp Site1 Site2 Delta-0 (ms) Dist (cm) Vel (m/s) Norm Vel (m/s)  Right Peroneal Motor (Ext Dig Brev)  Ankle    4.1 <6.0 5.4 >2.5 B Fib Ankle 10.0 45.0 45 >40  B Fib    14.1  4.3  Poplt B Fib 1.4 10.0 71 >40  Poplt    15.5  4.1         Left Tibial Motor (Abd Hall Brev)  Ankle    4.8 <6.0 3.6 >4  Knee Ankle 13.5 45.0 42 >40  Knee    18.3  2.2         Right Tibial Motor (Abd Hall Brev)  Ankle    4.1 <6.0 1.3 >4 Knee Ankle 11.8 45.0 38 >40  Knee    15.9  1.2          H Reflex Studies   NR H-Lat (ms) Lat Norm (ms) L-R H-Lat (ms)  Right Tibial (Gastroc)     35.92 <35    EMG   Side Muscle Ins Act Fibs Psw Fasc Number Recrt Dur Dur. Amp Amp. Poly Poly. Comment  Right AntTibialis Nml Nml Nml Nml Nml Nml Nml Nml Nml Nml Nml Nml N/A  Right BicepsFemS Nml Nml Nml Nml 1- Rapid Some 1+ Some 1+ Nml Nml N/A  Right RectFemoris Nml Nml Nml Nml Nml Nml Nml Nml Nml Nml Nml Nml N/A  Right GluteusMed Nml Nml Nml Nml Nml Nml Nml Nml Nml Nml Nml Nml N/A  Right Gastroc Nml Nml Nml Nml 1- Rapid Some 1+ Some 1+ Nml Nml N/A  Left Gastroc Nml Nml Nml Nml 1- Rapid Few 1+ Few 1+ Nml Nml N/A      Waveforms:

## 2017-02-27 ENCOUNTER — Other Ambulatory Visit: Payer: Self-pay | Admitting: Internal Medicine

## 2017-02-27 DIAGNOSIS — M5418 Radiculopathy, sacral and sacrococcygeal region: Secondary | ICD-10-CM

## 2017-04-11 DIAGNOSIS — J208 Acute bronchitis due to other specified organisms: Secondary | ICD-10-CM | POA: Diagnosis not present

## 2017-05-19 DIAGNOSIS — R05 Cough: Secondary | ICD-10-CM | POA: Diagnosis not present

## 2017-05-19 DIAGNOSIS — J22 Unspecified acute lower respiratory infection: Secondary | ICD-10-CM | POA: Diagnosis not present

## 2017-05-19 DIAGNOSIS — R509 Fever, unspecified: Secondary | ICD-10-CM | POA: Diagnosis not present

## 2017-06-02 DIAGNOSIS — R0602 Shortness of breath: Secondary | ICD-10-CM | POA: Diagnosis not present

## 2017-06-02 DIAGNOSIS — J018 Other acute sinusitis: Secondary | ICD-10-CM | POA: Diagnosis not present

## 2017-08-09 DIAGNOSIS — E119 Type 2 diabetes mellitus without complications: Secondary | ICD-10-CM | POA: Diagnosis not present

## 2017-08-09 DIAGNOSIS — R5383 Other fatigue: Secondary | ICD-10-CM | POA: Diagnosis not present

## 2018-01-21 DIAGNOSIS — M79672 Pain in left foot: Secondary | ICD-10-CM | POA: Diagnosis not present

## 2018-01-21 DIAGNOSIS — M25561 Pain in right knee: Secondary | ICD-10-CM | POA: Diagnosis not present

## 2018-05-12 DIAGNOSIS — M79672 Pain in left foot: Secondary | ICD-10-CM | POA: Diagnosis not present

## 2018-05-12 DIAGNOSIS — M79671 Pain in right foot: Secondary | ICD-10-CM | POA: Diagnosis not present

## 2018-05-12 DIAGNOSIS — M2021 Hallux rigidus, right foot: Secondary | ICD-10-CM | POA: Diagnosis not present

## 2018-05-28 DIAGNOSIS — M2021 Hallux rigidus, right foot: Secondary | ICD-10-CM | POA: Diagnosis not present

## 2018-05-28 DIAGNOSIS — M79672 Pain in left foot: Secondary | ICD-10-CM | POA: Diagnosis not present

## 2018-06-06 DIAGNOSIS — M79672 Pain in left foot: Secondary | ICD-10-CM | POA: Diagnosis not present

## 2018-06-06 DIAGNOSIS — M2021 Hallux rigidus, right foot: Secondary | ICD-10-CM | POA: Diagnosis not present

## 2019-04-20 ENCOUNTER — Ambulatory Visit: Payer: 59 | Attending: Internal Medicine

## 2020-06-14 ENCOUNTER — Ambulatory Visit (INDEPENDENT_AMBULATORY_CARE_PROVIDER_SITE_OTHER): Payer: 59

## 2020-06-14 ENCOUNTER — Ambulatory Visit: Payer: 59 | Admitting: Podiatry

## 2020-06-14 ENCOUNTER — Other Ambulatory Visit: Payer: Self-pay

## 2020-06-14 ENCOUNTER — Other Ambulatory Visit: Payer: Self-pay | Admitting: Podiatry

## 2020-06-14 ENCOUNTER — Encounter: Payer: Self-pay | Admitting: Podiatry

## 2020-06-14 DIAGNOSIS — M779 Enthesopathy, unspecified: Secondary | ICD-10-CM

## 2020-06-14 DIAGNOSIS — N182 Chronic kidney disease, stage 2 (mild): Secondary | ICD-10-CM | POA: Insufficient documentation

## 2020-06-14 DIAGNOSIS — N529 Male erectile dysfunction, unspecified: Secondary | ICD-10-CM | POA: Insufficient documentation

## 2020-06-14 DIAGNOSIS — D126 Benign neoplasm of colon, unspecified: Secondary | ICD-10-CM | POA: Insufficient documentation

## 2020-06-14 DIAGNOSIS — E1121 Type 2 diabetes mellitus with diabetic nephropathy: Secondary | ICD-10-CM | POA: Insufficient documentation

## 2020-06-14 DIAGNOSIS — N390 Urinary tract infection, site not specified: Secondary | ICD-10-CM | POA: Insufficient documentation

## 2020-06-14 DIAGNOSIS — M21961 Unspecified acquired deformity of right lower leg: Secondary | ICD-10-CM

## 2020-06-14 DIAGNOSIS — E785 Hyperlipidemia, unspecified: Secondary | ICD-10-CM | POA: Insufficient documentation

## 2020-06-14 DIAGNOSIS — K5730 Diverticulosis of large intestine without perforation or abscess without bleeding: Secondary | ICD-10-CM | POA: Insufficient documentation

## 2020-06-14 DIAGNOSIS — R413 Other amnesia: Secondary | ICD-10-CM | POA: Insufficient documentation

## 2020-06-14 DIAGNOSIS — M2021 Hallux rigidus, right foot: Secondary | ICD-10-CM

## 2020-06-14 DIAGNOSIS — M2041 Other hammer toe(s) (acquired), right foot: Secondary | ICD-10-CM

## 2020-06-14 MED ORDER — MELOXICAM 15 MG PO TABS: 15.0000 mg | ORAL_TABLET | Freq: Every day | ORAL | 0 refills | Status: AC

## 2020-06-14 NOTE — Patient Instructions (Signed)
The codes for the orthotic will be either L3020 or L3000 Diagnosis codes: Capsulitis M77.9 Hallux Rigidus M20.21 Metatarsal deformity 21.961

## 2020-06-17 NOTE — Progress Notes (Signed)
Subjective:   Patient ID: Manuel Singleton, male   DOB: 60 y.o.   MRN: [id number]   HPI 60 year old male presents the office today for concerns of discomfort to his right foot.  He states he has pain mostly to his big toe joint he also points along the first 3 toes rating his discomfort mostly the ball of his foot.  It feels like it is swollen no swelling to the toe joint.  He does like to ride although it causes discomfort.  Assisting in sensation at times.  He states he had a fracture approximately 4 to 5 years ago.  He is diabetic his last A1c was 9.  He has no other concerns.  No recent treatment.   Review of Systems  All other systems reviewed and are negative.  Past Medical History:  Diagnosis Date  . Hypertension     Past Surgical History:  Procedure Laterality Date  . TONSILLECTOMY       Current Outpatient Medications:  .  meloxicam (MOBIC) 15 MG tablet, Take 1 tablet (15 mg total) by mouth daily., Disp: 30 tablet, Rfl: 0 .  metFORMIN (GLUCOPHAGE-XR) 500 MG 24 hr tablet, 2 tablets, Disp: , Rfl:  .  clindamycin (CLEOCIN T) 1 % lotion, Apply topically daily., Disp: , Rfl:  .  diclofenac Sodium (VOLTAREN) 1 % GEL, diclofenac 1 % topical gel, Disp: , Rfl:  .  sildenafil (VIAGRA) 50 MG tablet, Take 25-50 mg by mouth daily as needed., Disp: , Rfl:   Allergies  Allergen Reactions  . Morphine And Related Itching         Objective:  Physical Exam  General: AAO x3, NAD  Dermatological: Skin is warm, dry and supple bilateral.There are no open sores, no preulcerative lesions, no rash or signs of infection present.  Vascular: Dorsalis Pedis artery and Posterior Tibial artery pedal pulses are 2/4 bilateral with immedate capillary fill time.There is no pain with calf compression, swelling, warmth, erythema.   Neruologic: Grossly intact via light touch bilateral.  Sensation intact with Semmes Weinstein monofilament.  Negative Tinel's sign.  Musculoskeletal: There is decreased  range of motion of the first MPJ and there is mild edema of the first MPJ.  No erythema or warmth.  Mild discomfort of the second MPJ but no area of pinpoint tenderness.  Muscular strength 5/5 in all groups tested bilateral.  Gait: Unassisted, Nonantalgic.       Assessment:   Right foot hallux rigidus, capsulitis     Plan:  -Treatment options discussed including all alternatives, risks, and complications -Etiology of symptoms were discussed -X-rays were obtained and reviewed with the patient.  Arthritic changes present the first MPJ.  No evidence of acute fracture. -We discussed both conservative as well as surgical treatment options.  However given his uncontrolled diabetes would recommend holding off on surgical intervention.  Conservatively we discussed injection therapy, shoe modifications and wearing stiffer soled shoe.  Discussed orthotics.  He is in a check with insurance company not given him the codes to check. -Mobic prn (use caution due to history of kidney disease) and if not using that he can use voltaren gel.   Trula Slade DPM

## 2020-07-01 ENCOUNTER — Other Ambulatory Visit: Payer: Self-pay | Admitting: Podiatry

## 2020-07-01 ENCOUNTER — Telehealth: Payer: Self-pay | Admitting: Podiatry

## 2020-07-01 MED ORDER — PENNSAID 2 % EX SOLN
1.0000 | Freq: Two times a day (BID) | CUTANEOUS | 1 refills | Status: AC
Start: 2020-07-01 — End: ?

## 2020-07-01 NOTE — Telephone Encounter (Signed)
Ok, giving patient a call to let him know

## 2020-07-01 NOTE — Telephone Encounter (Signed)
Patient calling to inform Dr. Jacqualyn Posey that the meloxicam (MOBIC) 15 MG tablet   , did not help with pain. However patients friend let him borrow a cream called Pennsaid. Patient said that helped more than the meloxicam and would like for [facility] to call in a prescription for the cream instead.

## 2020-07-01 NOTE — Telephone Encounter (Signed)
I have sent it to the pharmacy. Not sure if insurance will cover it but we can try.

## 2022-12-05 ENCOUNTER — Ambulatory Visit: Admission: RE | Admit: 2022-12-05 | Payer: 59 | Source: Ambulatory Visit

## 2022-12-05 ENCOUNTER — Other Ambulatory Visit: Payer: Self-pay | Admitting: Internal Medicine

## 2022-12-05 DIAGNOSIS — R059 Cough, unspecified: Secondary | ICD-10-CM

## 2023-11-06 ENCOUNTER — Encounter: Payer: Self-pay | Admitting: Obstetrics
